# Patient Record
Sex: Male | Born: 1965 | Hispanic: Yes | Marital: Single | Smoking: Current every day smoker
Health system: Southern US, Community
[De-identification: ages and names within clinical notes are randomized; demographics above are authoritative.]

## PROBLEM LIST (undated history)

## (undated) DIAGNOSIS — M109 Gout, unspecified: Secondary | ICD-10-CM

## (undated) DIAGNOSIS — M1990 Unspecified osteoarthritis, unspecified site: Secondary | ICD-10-CM

## (undated) DIAGNOSIS — E119 Type 2 diabetes mellitus without complications: Secondary | ICD-10-CM

---

## 2018-03-29 ENCOUNTER — Other Ambulatory Visit: Payer: Self-pay

## 2018-03-29 ENCOUNTER — Emergency Department: Payer: Self-pay

## 2018-03-29 ENCOUNTER — Encounter: Payer: Self-pay | Admitting: Emergency Medicine

## 2018-03-29 ENCOUNTER — Emergency Department
Admission: EM | Admit: 2018-03-29 | Discharge: 2018-03-29 | Disposition: A | Discharge status: Home / Self Care | Payer: Self-pay | Attending: Emergency Medicine | Admitting: Emergency Medicine

## 2018-03-29 DIAGNOSIS — E119 Type 2 diabetes mellitus without complications: Secondary | ICD-10-CM | POA: Insufficient documentation

## 2018-03-29 DIAGNOSIS — M19012 Primary osteoarthritis, left shoulder: Secondary | ICD-10-CM | POA: Insufficient documentation

## 2018-03-29 DIAGNOSIS — R05 Cough: Secondary | ICD-10-CM | POA: Insufficient documentation

## 2018-03-29 DIAGNOSIS — R053 Chronic cough: Secondary | ICD-10-CM

## 2018-03-29 HISTORY — DX: Type 2 diabetes mellitus without complications: E11.9

## 2018-03-29 LAB — CBC
HCT: 44.9 % (ref 40.0–52.0)
HEMOGLOBIN: 14.9 g/dL (ref 13.0–18.0)
MCH: 27.9 pg (ref 26.0–34.0)
MCHC: 33.1 g/dL (ref 32.0–36.0)
MCV: 84.3 fL (ref 80.0–100.0)
PLATELETS: 255 10*3/uL (ref 150–440)
RBC: 5.33 MIL/uL (ref 4.40–5.90)
RDW: 14.3 % (ref 11.5–14.5)
WBC: 13.5 10*3/uL — AB (ref 3.8–10.6)

## 2018-03-29 LAB — COMPREHENSIVE METABOLIC PANEL
ALK PHOS: 83 U/L (ref 38–126)
ALT: 19 U/L (ref 0–44)
AST: 18 U/L (ref 15–41)
Albumin: 3.5 g/dL (ref 3.5–5.0)
Anion gap: 8 (ref 5–15)
BUN: 16 mg/dL (ref 6–20)
CALCIUM: 9.1 mg/dL (ref 8.9–10.3)
CHLORIDE: 99 mmol/L (ref 98–111)
CO2: 26 mmol/L (ref 22–32)
CREATININE: 0.78 mg/dL (ref 0.61–1.24)
GFR calc Af Amer: >60 mL/min (ref >60)
GFR calc non Af Amer: >60 mL/min (ref >60)
Glucose, Bld: 340 mg/dL — ABNORMAL HIGH (ref 70–99)
Potassium: 4 mmol/L (ref 3.5–5.1)
SODIUM: 133 mmol/L — AB (ref 135–145)
Total Bilirubin: 0.8 mg/dL (ref 0.3–1.2)
Total Protein: 7.7 g/dL (ref 6.5–8.1)

## 2018-03-29 LAB — TROPONIN I: Troponin I: <0.03 ng/mL (ref <0.03)

## 2018-03-29 MED ORDER — CYCLOBENZAPRINE HCL 5 MG PO TABS: ORAL_TABLET | ORAL | 0 refills | Status: DC

## 2018-03-29 MED ORDER — NAPROXEN 500 MG PO TABS: 500.0000 mg | ORAL_TABLET | Freq: Two times a day (BID) | ORAL | 0 refills | Status: AC

## 2018-03-29 MED ORDER — IBUPROFEN 800 MG PO TABS
ORAL_TABLET | ORAL | Status: AC
  Filled 2018-03-29: qty 1

## 2018-03-29 MED ORDER — IBUPROFEN 800 MG PO TABS
800.0000 mg | ORAL_TABLET | Freq: Once | ORAL | Status: AC
  Administered 2018-03-29: 800 mg via ORAL

## 2018-03-29 MED ORDER — ALBUTEROL SULFATE HFA 108 (90 BASE) MCG/ACT IN AERS: 2.0000 | INHALATION_SPRAY | Freq: Four times a day (QID) | RESPIRATORY_TRACT | 0 refills | Status: AC | PRN

## 2018-03-29 NOTE — ED Provider Notes (Signed)
[facility] Emergency Department Provider Note  ____________________________________________  Time seen: Approximately 5:40 PM  I have reviewed the triage vital signs and the nursing notes.   HISTORY  Chief Complaint Shoulder Pain    HPI John [name] is a 52 y.o. male that presents emergency department for evaluation of left shoulder and chest pain since yesterday.  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  Pain in his shoulder and chest is worse when he lifts his arm.  It is also worse when he touches his chest.  He recently got over a cold, and has been coughing for the last week.  Cough improved today and he did not cough at all.  He states that he has an occasional chronic cough from smoking.  He smokes 10 cigarettes/day.  No fever, chills, jaw pain, arm pain, shortness of breath.   Past Medical History:  Diagnosis Date  . Diabetes mellitus without complication (HCC)     There are no active problems to display for this patient.   History reviewed. No pertinent surgical history.  Prior to Admission medications   Medication Sig Start Date End Date Taking? Authorizing Provider  albuterol (PROVENTIL HFA;VENTOLIN HFA) 108 (90 Base) MCG/ACT inhaler Inhale 2 puffs into the lungs every 6 (six) hours as needed for wheezing or shortness of breath. 03/29/18   Enid DerryWagner, Caellum Mancil, PA-C  cyclobenzaprine (FLEXERIL) 5 MG tablet Take 1-2 tablets 3 times daily as needed 03/29/18   Enid DerryWagner, Jamire Shabazz, PA-C  naproxen (NAPROSYN) 500 MG tablet Take 1 tablet (500 mg total) by mouth 2 (two) times daily with a meal. 03/29/18 03/29/19  Enid DerryWagner, Alexismarie Flaim, PA-C    Allergies Patient has no known allergies.  No family history on file.  Social History Social History   Tobacco Use  . Smoking status: Not on file  Substance Use Topics  . Alcohol use: Not on file  . Drug use: Not on file     Review of Systems  Constitutional: No fever/chills ENT: No upper respiratory  complaints. Respiratory: Positive for recent  cough. No SOB. Gastrointestinal: No abdominal pain.  No nausea, no vomiting.  Musculoskeletal: Positive for shoulder pain.  Skin: Negative for rash, abrasions, lacerations, ecchymosis.   ____________________________________________   PHYSICAL EXAM:  VITAL SIGNS: ED Triage Vitals [03/29/18 1451]  Enc Vitals Group     BP 139/74     Pulse Rate 93     Resp 20     Temp 98.2 F (36.8 C)     Temp Source Oral     SpO2 99 %     Weight 189 lb (85.7 kg)     Height 5\' 5"  (1.651 m)     Head Circumference      Peak Flow      Pain Score 10     Pain Loc      Pain Edu?      Excl. in GC?      Constitutional: Alert and oriented. Well appearing and in no acute distress. Eyes: Conjunctivae are normal. PERRL. EOMI. Head: Atraumatic. ENT:      Ears:      Nose: No congestion/rhinnorhea.      Mouth/Throat: Mucous membranes are moist.  Neck: No stridor.  Cardiovascular: Normal rate, regular rhythm.  Good peripheral circulation. Respiratory: Normal respiratory effort without tachypnea or retractions. Lungs CTAB. Good air entry to the bases with no decreased or absent breath sounds. Musculoskeletal: Full range of motion to all extremities. No gross deformities appreciated.  Tenderness  to palpation over left chest wall and left shoulder.  Pain elicited with range of motion of left shoulder.  Neurologic:  Normal speech and language. No gross focal neurologic deficits are appreciated.  Skin:  Skin is warm, dry and intact. No rash noted. Psychiatric: Mood and affect are normal. Speech and behavior are normal. Patient exhibits appropriate insight and judgement.   ____________________________________________   LABS (all labs ordered are listed, but only abnormal results are displayed)  Labs Reviewed  CBC - Abnormal; Notable for the following components:      Result Value   WBC 13.5 (*)    All other components within normal limits  COMPREHENSIVE  METABOLIC PANEL - Abnormal; Notable for the following components:   Sodium 133 (*)    Glucose, Bld 340 (*)    All other components within normal limits  TROPONIN I   ____________________________________________  EKG  NSR ____________________________________________  RADIOLOGY Lexine BatonI, Jaymison Luber, personally viewed and evaluated these images (plain radiographs) as part of my medical decision making, as well as reviewing the written report by the radiologist.  Dg Chest 2 View  Result Date: 03/29/2018 CLINICAL DATA:  Initial evaluation for acute cough, chest pain. EXAM: CHEST - 2 VIEW COMPARISON:  None. FINDINGS: The cardiac and mediastinal silhouettes are stable in size and contour, and remain within normal limits. The lungs are normally inflated. Mild scattered peribronchial thickening present within the upper lobes bilaterally. No airspace consolidation, pleural effusion, or pulmonary edema is identified. There is no pneumothorax. No acute osseous abnormality identified. IMPRESSION: Mild scattered peribronchial thickening within the upper lobes bilaterally, which could reflect sequelae of acute bronchiolitis given the history of cough. No consolidative opacity to suggest pneumonia. Electronically Signed   By: Rise MuBenjamin  McClintock M.D.   On: 03/29/2018 16:28   Dg Shoulder Left  Result Date: 03/29/2018 CLINICAL DATA:  Worsening left shoulder pain since fall a month ago. EXAM: LEFT SHOULDER - 2+ VIEW COMPARISON:  None. FINDINGS: No acute fracture or dislocation. Mild degenerative changes of the acromioclavicular joint. The glenohumeral joint space is preserved. Bone mineralization is normal. Soft tissues are unremarkable. IMPRESSION: 1.  No acute osseous abnormality. 2. Mild acromioclavicular osteoarthritis. Electronically Signed   By: Obie DredgeWilliam T Derry M.D.   On: 03/29/2018 16:28    ____________________________________________    PROCEDURES  Procedure(s) performed:     Procedures    Medications  ibuprofen (ADVIL,MOTRIN) tablet 800 mg (800 mg Oral Given 03/29/18 1647)     ____________________________________________   INITIAL IMPRESSION / ASSESSMENT AND PLAN / ED COURSE  Pertinent labs & imaging results that were available during my care of the patient were reviewed by me and considered in my medical decision making (see chart for details).  Review of the [place] CSRS was performed in accordance of the [facility] prior to dispensing any controlled drugs.   Patient's diagnosis is consistent with osteoarthritis, recent URI and chronic cough.  Vital signs and exam are reassuring.  Pain is reproducible with palpation and movement of shoulder. CBC remarkable for increased blood cell count, consistent with recent URI.  Patient has diabetes and is encouraged to follow-up with primary care for medication management.  He will increase salt intake tonight for mildly low sodium.  Chest x-ray consistent with recent URI.  Patient denies any coughing today and symptoms are improving.  Troponin is negative.  EKG shows normal sinus rhythm.  Shoulder x-ray consistent with osteoarthritis. Pain improved with ibuprofen. Patient will be discharged home with prescriptions for naproxen, Flexeril, albuterol  inhaler. Patient is to follow up with PCP as directed. Patient is given ED precautions to return to the ED for any worsening or new symptoms.     ____________________________________________  FINAL CLINICAL IMPRESSION(S) / ED DIAGNOSES  Final diagnoses:  Osteoarthritis of left shoulder, unspecified osteoarthritis type  Chronic cough      NEW MEDICATIONS STARTED DURING THIS VISIT:  ED Discharge Orders         Ordered    albuterol (PROVENTIL HFA;VENTOLIN HFA) 108 (90 Base) MCG/ACT inhaler  Every 6 hours PRN     03/29/18 1749    naproxen (NAPROSYN) 500 MG tablet  2 times daily with meals     03/29/18 1749    cyclobenzaprine (FLEXERIL) 5 MG tablet     03/29/18 1749               This chart was dictated using voice recognition software/Dragon. Despite best efforts to proofread, errors can occur which can change the meaning. Any change was purely unintentional.    Enid Derry, PA-C 03/29/18 1844    Jene Every, MD 03/30/18 1406

## 2018-03-29 NOTE — ED Notes (Signed)
See triage note  Presents with pain to left shoulder for couple of days  Then developed prod cough and some discomfort in chest with cough  Afebrile on arrival

## 2018-03-29 NOTE — Discharge Instructions (Signed)
Please make an appointment with primary care as soon as possible for daily medication refills.

## 2018-03-29 NOTE — ED Triage Notes (Signed)
L shoulder pain that increases with movment since yesterday. Denies fall or injury.

## 2019-04-21 ENCOUNTER — Emergency Department: Payer: Self-pay

## 2019-04-21 ENCOUNTER — Encounter: Payer: Self-pay | Admitting: Emergency Medicine

## 2019-04-21 ENCOUNTER — Other Ambulatory Visit: Payer: Self-pay

## 2019-04-21 ENCOUNTER — Emergency Department
Admission: EM | Admit: 2019-04-21 | Discharge: 2019-04-21 | Disposition: A | Discharge status: Home / Self Care | Payer: Self-pay | Attending: Emergency Medicine | Admitting: Emergency Medicine

## 2019-04-21 DIAGNOSIS — M19031 Primary osteoarthritis, right wrist: Secondary | ICD-10-CM | POA: Insufficient documentation

## 2019-04-21 DIAGNOSIS — M65272 Calcific tendinitis, left ankle and foot: Secondary | ICD-10-CM | POA: Insufficient documentation

## 2019-04-21 DIAGNOSIS — E119 Type 2 diabetes mellitus without complications: Secondary | ICD-10-CM | POA: Insufficient documentation

## 2019-04-21 DIAGNOSIS — F17210 Nicotine dependence, cigarettes, uncomplicated: Secondary | ICD-10-CM | POA: Insufficient documentation

## 2019-04-21 MED ORDER — KETOROLAC TROMETHAMINE 30 MG/ML IJ SOLN
30.0000 mg | Freq: Once | INTRAMUSCULAR | Status: AC
  Administered 2019-04-21: 10:00:00 30 mg via INTRAMUSCULAR
  Filled 2019-04-21: qty 1

## 2019-04-21 MED ORDER — MELOXICAM 15 MG PO TABS: 15.0000 mg | ORAL_TABLET | Freq: Every day | ORAL | 0 refills | Status: AC

## 2019-04-21 NOTE — Discharge Instructions (Signed)
With [facility] or 1 of the clinics listed on your discharge papers.  You may also go to your PCP if you have one.  Begin taking medication as directed.  Ace wrap for your ankle as needed for support.  You may use ice and elevation to your wrist and ankle as needed for pain and swelling.

## 2019-04-21 NOTE — ED Triage Notes (Signed)
Right wrist pain and swelling.  Says was here for ankle pain few weeks a go.  No injury

## 2019-04-21 NOTE — ED Provider Notes (Signed)
[facility] Emergency Department Provider Note  ____________________________________________   First MD Initiated Contact with Patient 04/21/19 [phone]     (approximate)  I have reviewed the triage vital signs and the nursing notes.   HISTORY  Chief Complaint Wrist Pain   HPI Andres LabrumLuis Scruton is a 53 y.o. male presents to the ED with complaint of right wrist pain that began 1 week ago without any history of injury.  He also states that his left ankle began having pain and swelling several weeks ago and ibuprofen is not helping.  He denies any injury to both areas.  He rates his pain as a 10/10.     Past Medical History:  Diagnosis Date  . Diabetes mellitus without complication (HCC)     There are no active problems to display for this patient.   History reviewed. No pertinent surgical history.  Prior to Admission medications   Medication Sig Start Date End Date Taking? Authorizing Provider  albuterol (PROVENTIL HFA;VENTOLIN HFA) 108 (90 Base) MCG/ACT inhaler Inhale 2 puffs into the lungs every 6 (six) hours as needed for wheezing or shortness of breath. 03/29/18   Enid DerryWagner, Ashley, PA-C  meloxicam (MOBIC) 15 MG tablet Take 1 tablet (15 mg total) by mouth daily. 04/21/19 04/20/20  Tommi RumpsSummers, Rhonda L, PA-C    Allergies Patient has no known allergies.  No family history on file.  Social History Social History   Tobacco Use  . Smoking status: Current Every Day Smoker  . Smokeless tobacco: Never Used  Substance Use Topics  . Alcohol use: Not on file  . Drug use: Not on file    Review of Systems Constitutional: No fever/chills Cardiovascular: Denies chest pain. Respiratory: Denies shortness of breath. Gastrointestinal: No abdominal pain.  No nausea, no vomiting.  Musculoskeletal: Positive for right wrist pain and left ankle pain. Skin: Negative for rash. Neurological: Negative for headaches, focal weakness or numbness.  ___________________________________________   PHYSICAL EXAM:  VITAL SIGNS: ED Triage Vitals  Enc Vitals Group     BP 04/21/19 0828 (!) 156/83     Pulse Rate 04/21/19 0828 71     Resp 04/21/19 0828 19     Temp 04/21/19 0828 97.8 F (36.6 C)     Temp Source 04/21/19 0828 Oral     SpO2 04/21/19 0828 98 %     Weight 04/21/19 0824 178 lb (80.7 kg)     Height 04/21/19 0824 5\' 5"  (1.651 m)     Head Circumference --      Peak Flow --      Pain Score 04/21/19 0824 10     Pain Loc --      Pain Edu? --      Excl. in GC? --     Constitutional: Alert and oriented. Well appearing and in no acute distress. Eyes: Conjunctivae are normal.  Head: Atraumatic. Neck: No stridor.   Cardiovascular: Normal rate, regular rhythm. Grossly normal heart sounds.  Good peripheral circulation. Respiratory: Normal respiratory effort.  No retractions. Lungs CTAB. Musculoskeletal: On examination of the right wrist in comparison with the left there is some soft tissue edema and generalized tenderness to palpation.  Patient is able to flex and extend with minimal restriction.  Skin is intact.  No erythema or warmth is noted.  Examination of the left ankle shows mild soft tissue edema especially on the lateral aspect.  Patient is able to flex and extend and ambulate without any assistance.  Skin is intact and  no erythema or warmth is noted. Neurologic:  Normal speech and language. No gross focal neurologic deficits are appreciated. No gait instability. Skin:  Skin is warm, dry and intact. No rash noted. Psychiatric: Mood and affect are normal. Speech and behavior are normal.  ____________________________________________   LABS (all labs ordered are listed, but only abnormal results are displayed)  Labs Reviewed - No data to display  RADIOLOGY  Official radiology report(s): Dg Wrist Complete Right  Result Date: 04/21/2019 CLINICAL DATA:  Pain and swelling of the right wrist for 1 week. No known injury.  EXAM: RIGHT WRIST - COMPLETE 3+ VIEW COMPARISON:  None. FINDINGS: There is no evidence of fracture or dislocation. There is no evidence of arthropathy or other focal bone abnormality. Soft tissues are unremarkable. IMPRESSION: Normal exam. Electronically Signed   By: Lorriane Shire M.D.   On: 04/21/2019 09:13   Dg Ankle Complete Left  Result Date: 04/21/2019 CLINICAL DATA:  Left ankle pain for several weeks. No known injury. EXAM: LEFT ANKLE COMPLETE - 3+ VIEW COMPARISON:  None. FINDINGS: There is no fracture, dislocation, or ankle joint effusion. Small marginal osteophytes on the distal tibia including at the tip of the medial malleolus. Calcific tendinopathy of the distal Achilles tendon. IMPRESSION: Minimal degenerative changes of the distal tibia. Calcific tendinopathy of the distal Achilles tendon. Electronically Signed   By: Lorriane Shire M.D.   On: 04/21/2019 09:15    ____________________________________________   PROCEDURES  Procedure(s) performed (including Critical Care):  Procedures Ace wrap and postop shoe was applied by Halford Decamp, RN.  ____________________________________________   INITIAL IMPRESSION / ASSESSMENT AND PLAN / ED COURSE  As part of my medical decision making, I reviewed the following data within the electronic MEDICAL RECORD NUMBER Notes from prior ED visits and [place] Controlled Substance Database  53 year old male presents to the ED with complaint of right wrist pain and left ankle pain for 1 to 2 weeks without any history of injury.  He has been taking ibuprofen without any relief.  X-rays show osteoarthritis and a calcific tendinitis in his left ankle.  Patient was made aware and he was given a Toradol shot for his pain at the present.  Patient was also given a prescription for meloxicam 15 mg 1 daily with food.  He is to follow-up with his PCP or 1 of the clinics listed on his discharge papers. ____________________________________________   FINAL CLINICAL  IMPRESSION(S) / ED DIAGNOSES  Final diagnoses:  Osteoarthritis of right wrist, unspecified osteoarthritis type  Calcific tendinitis, left ankle and foot     ED Discharge Orders         Ordered    meloxicam (MOBIC) 15 MG tablet  Daily     04/21/19 0955           Note:  This document was prepared using Dragon voice recognition software and may include unintentional dictation errors.    Johnn Hai, PA-C 04/21/19 1012    Blake Divine, MD 04/21/19 [phone]

## 2019-04-21 NOTE — ED Notes (Signed)
See triage note  Presents with pain to right wrist for about 1 week w/o injury   Swelling noted to right wrist  Good pulses  Also has had some pain to left ankle a few weeks ago

## 2019-07-03 ENCOUNTER — Other Ambulatory Visit: Payer: Self-pay

## 2019-07-03 ENCOUNTER — Emergency Department: Payer: Self-pay

## 2019-07-03 ENCOUNTER — Encounter: Payer: Self-pay | Admitting: Emergency Medicine

## 2019-07-03 DIAGNOSIS — R0789 Other chest pain: Secondary | ICD-10-CM | POA: Insufficient documentation

## 2019-07-03 DIAGNOSIS — Z5321 Procedure and treatment not carried out due to patient leaving prior to being seen by health care provider: Secondary | ICD-10-CM | POA: Insufficient documentation

## 2019-07-03 LAB — BASIC METABOLIC PANEL
Anion gap: 10 (ref 5–15)
BUN: 21 mg/dL — ABNORMAL HIGH (ref 6–20)
CO2: 25 mmol/L (ref 22–32)
Calcium: 9.1 mg/dL (ref 8.9–10.3)
Chloride: 102 mmol/L (ref 98–111)
Creatinine, Ser: 0.75 mg/dL (ref 0.61–1.24)
GFR calc Af Amer: >60 mL/min (ref >60)
GFR calc non Af Amer: >60 mL/min (ref >60)
Glucose, Bld: 330 mg/dL — ABNORMAL HIGH (ref 70–99)
Potassium: 4.1 mmol/L (ref 3.5–5.1)
Sodium: 137 mmol/L (ref 135–145)

## 2019-07-03 LAB — CBC
HCT: 44.2 % (ref 39.0–52.0)
Hemoglobin: 14.4 g/dL (ref 13.0–17.0)
MCH: 27 pg (ref 26.0–34.0)
MCHC: 32.6 g/dL (ref 30.0–36.0)
MCV: 82.8 fL (ref 80.0–100.0)
Platelets: 294 10*3/uL (ref 150–400)
RBC: 5.34 MIL/uL (ref 4.22–5.81)
RDW: 12.8 % (ref 11.5–15.5)
WBC: 11.1 10*3/uL — ABNORMAL HIGH (ref 4.0–10.5)
nRBC: 0 % (ref 0.0–0.2)

## 2019-07-03 LAB — TROPONIN I (HIGH SENSITIVITY): Troponin I (High Sensitivity): 2 ng/L (ref <18)

## 2019-07-03 NOTE — ED Triage Notes (Signed)
Pt arrived via POV with reports of central chest pain since last night, pt states pain hurts when he takes in a deep breath and moves his arms a certain way. Pt also c/o shortness of breath.  Pt also reports back pain w/ the chest pain.

## 2019-07-04 ENCOUNTER — Other Ambulatory Visit: Payer: Self-pay

## 2019-07-04 ENCOUNTER — Emergency Department
Admission: EM | Admit: 2019-07-04 | Discharge: 2019-07-04 | Disposition: A | Discharge status: Home / Self Care | Payer: Self-pay | Attending: Emergency Medicine | Admitting: Emergency Medicine

## 2019-07-04 DIAGNOSIS — R0789 Other chest pain: Secondary | ICD-10-CM | POA: Insufficient documentation

## 2019-07-04 DIAGNOSIS — Z7984 Long term (current) use of oral hypoglycemic drugs: Secondary | ICD-10-CM | POA: Insufficient documentation

## 2019-07-04 DIAGNOSIS — Z791 Long term (current) use of non-steroidal anti-inflammatories (NSAID): Secondary | ICD-10-CM | POA: Insufficient documentation

## 2019-07-04 DIAGNOSIS — F17200 Nicotine dependence, unspecified, uncomplicated: Secondary | ICD-10-CM | POA: Insufficient documentation

## 2019-07-04 DIAGNOSIS — E119 Type 2 diabetes mellitus without complications: Secondary | ICD-10-CM | POA: Insufficient documentation

## 2019-07-04 LAB — TROPONIN I (HIGH SENSITIVITY): Troponin I (High Sensitivity): <2 ng/L (ref <18)

## 2019-07-04 MED ORDER — NAPROXEN 500 MG PO TABS: 500.0000 mg | ORAL_TABLET | Freq: Two times a day (BID) | ORAL | 0 refills | Status: DC

## 2019-07-04 MED ORDER — METFORMIN HCL 500 MG PO TABS: 500.0000 mg | ORAL_TABLET | Freq: Two times a day (BID) | ORAL | 0 refills | Status: DC

## 2019-07-04 NOTE — ED Triage Notes (Signed)
Pt was here last night with substernal chest tightness but LWBS, pt states he has had the pain since yesterday morning. Denies SOB, diaphoresis nausea or other sx with it. Pt is in NAD. Spoke with EDP and only wants troponin and ECG repeated at this time,.

## 2019-07-04 NOTE — ED Notes (Signed)
Call x 1

## 2019-07-04 NOTE — ED Notes (Signed)
Entered room to discharge patient. Pt not in room at this time. Unable to locate patient. Will keep d/c paperwork and prescriptions just in case patient comes back to get them. Spoke with prior Archivist and informed her of current situation. Not sure why pt left.

## 2019-07-04 NOTE — ED Provider Notes (Signed)
[facility] Emergency Department Provider Note  ____________________________________________  Time seen: Approximately 11:01 AM  I have reviewed the triage vital signs and the nursing notes.   HISTORY  Chief Complaint Chest Pain    HPI John LabrumLuis [name] is a 53 y.o. male with a history of diabetes who comes the ED complaining of gradual onset of central chest pain described as tightness that started yesterday afternoon.  Constant, worse with movement, no alleviating factors.  No shortness of breath diaphoresis vomiting or radiation.  Not exertional, not pleuritic.  No significant trauma.  Currently mild intensity.  Patient is on Metformin as well as another diabetes medication.  He reports that he takes them only intermittently to try and save on cost due to lack of insurance and employment currently.  He reports he does have enough of his medicines at home.  He does not take his blood sugar regularly at home.    Past Medical History:  Diagnosis Date  . Diabetes mellitus without complication (HCC)      There are no active problems to display for this patient.    History reviewed. No pertinent surgical history.   Prior to Admission medications   Medication Sig Start Date End Date Taking? Authorizing Provider  albuterol (PROVENTIL HFA;VENTOLIN HFA) 108 (90 Base) MCG/ACT inhaler Inhale 2 puffs into the lungs every 6 (six) hours as needed for wheezing or shortness of breath. 03/29/18   Enid DerryWagner, Ashley, PA-C  meloxicam (MOBIC) 15 MG tablet Take 1 tablet (15 mg total) by mouth daily. 04/21/19 04/20/20  Tommi RumpsSummers, Rhonda L, PA-C  metFORMIN (GLUCOPHAGE) 500 MG tablet Take 1 tablet (500 mg total) by mouth 2 (two) times daily with a meal. 07/04/19   Sharman CheekStafford, Elyas Villamor, MD  naproxen (NAPROSYN) 500 MG tablet Take 1 tablet (500 mg total) by mouth 2 (two) times daily with a meal. 07/04/19   Sharman CheekStafford, Caitlynn Ju, MD     Allergies Patient has no known allergies.   No family history  on file.  Social History Social History   Tobacco Use  . Smoking status: Current Every Day Smoker  . Smokeless tobacco: Never Used  Substance Use Topics  . Alcohol use: Not Currently  . Drug use: Not Currently    Review of Systems  Constitutional:   No fever or chills.  ENT:   No sore throat. No rhinorrhea. Cardiovascular: Positive chest pain as above without syncope. Respiratory:   No dyspnea or cough. Gastrointestinal:   Negative for abdominal pain, vomiting and diarrhea.  Musculoskeletal:   Negative for focal pain or swelling All other systems reviewed and are negative except as documented above in ROS and HPI.  ____________________________________________   PHYSICAL EXAM:  VITAL SIGNS: ED Triage Vitals  Enc Vitals Group     BP 07/04/19 0819 139/82     Pulse Rate 07/04/19 0819 74     Resp 07/04/19 0819 16     Temp 07/04/19 0819 98.8 F (37.1 C)     Temp Source 07/04/19 0819 Oral     SpO2 07/04/19 0819 96 %     Weight 07/04/19 0816 178 lb (80.7 kg)     Height 07/04/19 0816 5\' 5"  (1.651 m)     Head Circumference --      Peak Flow --      Pain Score 07/04/19 0816 8     Pain Loc --      Pain Edu? --      Excl. in GC? --  Vital signs reviewed, nursing assessments reviewed.   Constitutional:   Alert and oriented. Non-toxic appearance. Eyes:   Conjunctivae are normal. EOMI. PERRL. ENT      Head:   Normocephalic and atraumatic.      Nose:   Wearing a mask.      Mouth/Throat:   Wearing a mask.      Neck:   No meningismus. Full ROM. Hematological/Lymphatic/Immunilogical:   No cervical lymphadenopathy. Cardiovascular:   RRR. Symmetric bilateral radial and DP pulses.  No murmurs. Cap refill less than 2 seconds. Respiratory:   Normal respiratory effort without tachypnea/retractions. Breath sounds are clear and equal bilaterally. No wheezes/rales/rhonchi. Gastrointestinal:   Soft and nontender. Non distended. There is no CVA tenderness.  No rebound, rigidity, or  guarding.  Musculoskeletal:   Normal range of motion in all extremities. No joint effusions.  No lower extremity tenderness.  No edema.  Chest wall exquisitely tender to the touch over the sternomanubrial junction, reproducing his pain Neurologic:   Normal speech and language.  Motor grossly intact. No acute focal neurologic deficits are appreciated.  Skin:    Skin is warm, dry and intact. No rash noted.  No petechiae, purpura, or bullae.  ____________________________________________    LABS (pertinent positives/negatives) (all labs ordered are listed, but only abnormal results are displayed) Labs Reviewed  TROPONIN I (HIGH SENSITIVITY)  TROPONIN I (HIGH SENSITIVITY)   ____________________________________________   EKG  Interpreted by me Normal sinus rhythm rate of 79, normal axis and intervals.  Poor R wave progression.  Normal ST segments and T waves.  No ischemic changes.  Slight PR depression in the inferior leads but overall not consistent with pericarditis.  ____________________________________________    RADIOLOGY  Dg Chest 2 View  Result Date: 07/03/2019 CLINICAL DATA:  Chest pain EXAM: CHEST - 2 VIEW COMPARISON:  03/29/2018 FINDINGS: Heart and mediastinal contours are within normal limits. No focal opacities or effusions. No acute bony abnormality. IMPRESSION: No active cardiopulmonary disease. Electronically Signed   By: Rolm Baptise M.D.   On: 07/03/2019 22:41    ____________________________________________   PROCEDURES Procedures  ____________________________________________    CLINICAL IMPRESSION / ASSESSMENT AND PLAN / ED COURSE  Medications ordered in the ED: Medications - No data to display  Pertinent labs & imaging results that were available during my care of the patient were reviewed by me and considered in my medical decision making (see chart for details).  John [name] was evaluated in Emergency Department on 07/04/2019 for the symptoms  described in the history of present illness. He was evaluated in the context of the global COVID-19 pandemic, which necessitated consideration that the patient might be at risk for infection with the SARS-CoV-2 virus that causes COVID-19. Institutional protocols and algorithms that pertain to the evaluation of patients at risk for COVID-19 are in a state of rapid change based on information released by regulatory bodies including the CDC and federal and state organizations. These policies and algorithms were followed during the patient's care in the ED.   Patient presents with atypical chest pain which appears to be due to chest wall strain/inflammation.Considering the patient's symptoms, medical history, and physical examination today, I have low suspicion for ACS, PE, TAD, pneumothorax, carditis, mediastinitis, pneumonia, CHF, or sepsis.  Managed with NSAIDs.  Provided a refill prescription for his Metformin in case he runs out, encouraged to take Metformin every day and check his blood sugar and follow-up with primary care.  States he is awaiting on Medicaid to  transfer from [place] from where he recently moved.     ____________________________________________   FINAL CLINICAL IMPRESSION(S) / ED DIAGNOSES    Final diagnoses:  Chest wall pain     ED Discharge Orders         Ordered    naproxen (NAPROSYN) 500 MG tablet  2 times daily with meals     07/04/19 1101    metFORMIN (GLUCOPHAGE) 500 MG tablet  2 times daily with meals     07/04/19 1101          Portions of this note were generated with dragon dictation software. Dictation errors may occur despite best attempts at proofreading.   Sharman Cheek, MD 07/04/19 1105

## 2019-07-04 NOTE — ED Notes (Signed)
No answer in waiting room when called 3x

## 2019-10-13 ENCOUNTER — Encounter: Payer: Self-pay | Admitting: Emergency Medicine

## 2019-10-13 ENCOUNTER — Other Ambulatory Visit: Payer: Self-pay

## 2019-10-13 DIAGNOSIS — M109 Gout, unspecified: Secondary | ICD-10-CM | POA: Insufficient documentation

## 2019-10-13 DIAGNOSIS — Z79899 Other long term (current) drug therapy: Secondary | ICD-10-CM | POA: Insufficient documentation

## 2019-10-13 DIAGNOSIS — F17210 Nicotine dependence, cigarettes, uncomplicated: Secondary | ICD-10-CM | POA: Insufficient documentation

## 2019-10-13 DIAGNOSIS — M1711 Unilateral primary osteoarthritis, right knee: Secondary | ICD-10-CM | POA: Insufficient documentation

## 2019-10-13 DIAGNOSIS — E119 Type 2 diabetes mellitus without complications: Secondary | ICD-10-CM | POA: Insufficient documentation

## 2019-10-13 DIAGNOSIS — Z7984 Long term (current) use of oral hypoglycemic drugs: Secondary | ICD-10-CM | POA: Insufficient documentation

## 2019-10-13 NOTE — ED Triage Notes (Signed)
Pt to triage via w/c, mask in place, appears uncomfortable, restless; c/o rt knee pain & swelling tonight with no known injury; no redness noted

## 2019-10-14 ENCOUNTER — Emergency Department
Admission: EM | Admit: 2019-10-14 | Discharge: 2019-10-14 | Disposition: A | Discharge status: Home / Self Care | Payer: Self-pay | Attending: Emergency Medicine | Admitting: Emergency Medicine

## 2019-10-14 ENCOUNTER — Emergency Department: Payer: Self-pay

## 2019-10-14 DIAGNOSIS — M1711 Unilateral primary osteoarthritis, right knee: Secondary | ICD-10-CM

## 2019-10-14 DIAGNOSIS — M109 Gout, unspecified: Secondary | ICD-10-CM

## 2019-10-14 LAB — URIC ACID: Uric Acid, Serum: 5.5 mg/dL (ref 3.7–8.6)

## 2019-10-14 MED ORDER — COLCHICINE 0.3 MG HALF TABLET
0.3000 mg | ORAL_TABLET | Freq: Once | ORAL | Status: AC
  Administered 2019-10-14: 02:00:00 0.3 mg via ORAL
  Filled 2019-10-14: qty 1

## 2019-10-14 MED ORDER — KETOROLAC TROMETHAMINE 30 MG/ML IJ SOLN
30.0000 mg | Freq: Once | INTRAMUSCULAR | Status: AC
  Administered 2019-10-14: 30 mg via INTRAVENOUS
  Filled 2019-10-14: qty 1

## 2019-10-14 MED ORDER — INDOMETHACIN 50 MG PO CAPS: 50.0000 mg | ORAL_CAPSULE | Freq: Three times a day (TID) | ORAL | 0 refills | Status: AC

## 2019-10-14 NOTE — ED Notes (Signed)
Patient resting in bed and provided snacks by [employer]

## 2019-10-14 NOTE — ED Notes (Signed)
Patient resting in bed with pants under right knee. Patient states that he has swelling and pain in the right knee that started tonight. Patient has swelling noted above the knee

## 2019-10-14 NOTE — ED Provider Notes (Signed)
[facility] Emergency Department Provider Note  ____________________________________________   First MD Initiated Contact with Patient 10/14/19 0045     (approximate)  I have reviewed the triage vital signs and the nursing notes.   HISTORY  Chief Complaint Knee Pain   HPI Larkin Morelos is a 54 y.o. male with below list of previous medical conditions presents to the emergency department secondary to nontraumatic right knee pain and swelling which patient states began tonight.  Patient denies any fever no redness to the knee.  Patient states that he was evaluated at a hospital in [facility] for the same in the past.  Patient states current pain score is 9 out of 10        Past Medical History:  Diagnosis Date  . Diabetes mellitus without complication (HCC)     There are no problems to display for this patient.   History reviewed. No pertinent surgical history.  Prior to Admission medications   Medication Sig Start Date End Date Taking? Authorizing Provider  albuterol (PROVENTIL HFA;VENTOLIN HFA) 108 (90 Base) MCG/ACT inhaler Inhale 2 puffs into the lungs every 6 (six) hours as needed for wheezing or shortness of breath. 03/29/18   Enid Derry, PA-C  meloxicam (MOBIC) 15 MG tablet Take 1 tablet (15 mg total) by mouth daily. 04/21/19 04/20/20  Tommi Rumps, PA-C  metFORMIN (GLUCOPHAGE) 500 MG tablet Take 1 tablet (500 mg total) by mouth 2 (two) times daily with a meal. 07/04/19   Sharman Cheek, MD  naproxen (NAPROSYN) 500 MG tablet Take 1 tablet (500 mg total) by mouth 2 (two) times daily with a meal. 07/04/19   Sharman Cheek, MD    Allergies Patient has no known allergies.  No family history on file.  Social History Social History   Tobacco Use  . Smoking status: Current Every Day Smoker  . Smokeless tobacco: Never Used  Substance Use Topics  . Alcohol use: Not Currently  . Drug use: Not Currently    Review of  Systems Constitutional: No fever/chills Eyes: No visual changes. ENT: No sore throat. Cardiovascular: Denies chest pain. Respiratory: Denies shortness of breath. Gastrointestinal: No abdominal pain.  No nausea, no vomiting.  No diarrhea.  No constipation. Genitourinary: Negative for dysuria. Musculoskeletal: Negative for neck pain.  Negative for back pain.  Positive for right knee pain Integumentary: Negative for rash. Neurological: Negative for headaches, focal weakness or numbness.  ____________________________________________   PHYSICAL EXAM:  VITAL SIGNS: ED Triage Vitals  Enc Vitals Group     BP 10/13/19 2357 (!) 141/89     Pulse Rate 10/13/19 2357 89     Resp 10/13/19 2357 18     Temp 10/13/19 2357 98.9 F (37.2 C)     Temp src --      SpO2 10/13/19 2357 98 %     Weight 10/13/19 2355 80.7 kg (178 lb)     Height 10/13/19 2355 1.676 m (5\' 6" )     Head Circumference --      Peak Flow --      Pain Score 10/13/19 2355 9     Pain Loc --      Pain Edu? --      Excl. in [place]? --     Constitutional: Alert and oriented.  Apparent discomfort Eyes: Conjunctivae are normal.  Mouth/Throat: Patient is wearing a mask. Neck: No stridor.  No meningeal signs.   Cardiovascular: Normal rate, regular rhythm. Good peripheral circulation. Grossly normal heart  sounds. Respiratory: Normal respiratory effort.  No retractions. Musculoskeletal: Right knee warm to the touch no evidence of cellulitis.  Swelling noted to the knee as well Neurologic:  Normal speech and language. No gross focal neurologic deficits are appreciated.  Skin:  Skin is warm, dry and intact. Psychiatric: Mood and affect are normal. Speech and behavior are normal.  ____________________________________________   LABS (all labs ordered are listed, but only abnormal results are displayed)  Labs Reviewed  URIC ACID   _________________________________________  RADIOLOGY I, Ch[place]ar, personally viewed and  evaluated these images (plain radiographs) as part of my medical decision making, as well as reviewing the written report by the radiologist.  ED MD interpretation: Moderate suprapatellar joint effusion mild osteoarthritis noted on x-ray per radiologist  Official radiology report(s): DG Knee Complete 4 Views Right  Result Date: 10/14/2019 CLINICAL DATA:  Right knee pain and swelling, no history of trauma EXAM: RIGHT KNEE - COMPLETE 4+ VIEW COMPARISON:  None. FINDINGS: Frontal, bilateral oblique, and lateral views of the right knee are obtained. There is mild patellofemoral compartment osteoarthritis. Chondrocalcinosis is seen within the lateral compartment. No fracture, subluxation, or dislocation. Moderate suprapatellar effusion. Remaining soft tissues are unremarkable. IMPRESSION: 1. Moderate suprapatellar joint effusion. 2. Mild osteoarthritis. Electronically Signed   By: Randa Ngo M.D.   On: 10/14/2019 00:36    Procedures   ____________________________________________   INITIAL IMPRESSION / MDM / ASSESSMENT AND PLAN / ED COURSE  As part of my medical decision making, I reviewed the following data within the electronic MEDICAL RECORD NUMBER   54 year old male presented with above-stated history and physical exam consistent with gout.  Patient given Toradol 30 mg IV and colchicine 0.3 mg with current pain score now 2 out of 10.  I suspect gout to be the etiology for the patient's discomfort.  Patient will be prescribed indomethacin for home  ____________________________________________  FINAL CLINICAL IMPRESSION(S) / ED DIAGNOSES  Final diagnoses:  Acute gout of right knee, unspecified cause  Osteoarthritis of right knee, unspecified osteoarthritis type     MEDICATIONS GIVEN DURING THIS VISIT:  Medications  colchicine tablet 0.3 mg (0.3 mg Oral Given 10/14/19 0154)  ketorolac (TORADOL) 30 MG/ML injection 30 mg (30 mg Intravenous Given 10/14/19 0134)     ED Discharge Orders     None      *Please note:  Efrem Pitstick was evaluated in Emergency Department on 10/14/2019 for the symptoms described in the history of present illness. He was evaluated in the context of the global COVID-19 pandemic, which necessitated consideration that the patient might be at risk for infection with the SARS-CoV-2 virus that causes COVID-19. Institutional protocols and algorithms that pertain to the evaluation of patients at risk for COVID-19 are in a state of rapid change based on information released by regulatory bodies including the CDC and federal and state organizations. These policies and algorithms were followed during the patient's care in the ED.  Some ED evaluations and interventions may be delayed as a result of limited staffing during the pandemic.*  Note:  This document was prepared using Dragon voice recognition software and may include unintentional dictation errors.   Gregor Hams, MD 10/14/19 [phone]

## 2019-10-14 NOTE — ED Notes (Signed)
Patient provided warm blanket and juice with crackers. Resting quietly

## 2019-10-14 NOTE — ED Notes (Signed)
Patient states pain has improved and it is not as painful to move his knee

## 2019-11-10 ENCOUNTER — Emergency Department
Admission: EM | Admit: 2019-11-10 | Discharge: 2019-11-10 | Disposition: A | Discharge status: Home / Self Care | Payer: Medicaid Other | Attending: Emergency Medicine | Admitting: Emergency Medicine

## 2019-11-10 ENCOUNTER — Other Ambulatory Visit: Payer: Self-pay

## 2019-11-10 DIAGNOSIS — F17210 Nicotine dependence, cigarettes, uncomplicated: Secondary | ICD-10-CM | POA: Insufficient documentation

## 2019-11-10 DIAGNOSIS — R739 Hyperglycemia, unspecified: Secondary | ICD-10-CM

## 2019-11-10 DIAGNOSIS — Z760 Encounter for issue of repeat prescription: Secondary | ICD-10-CM | POA: Insufficient documentation

## 2019-11-10 DIAGNOSIS — E1165 Type 2 diabetes mellitus with hyperglycemia: Secondary | ICD-10-CM | POA: Insufficient documentation

## 2019-11-10 LAB — GLUCOSE, CAPILLARY
Glucose-Capillary: 306 mg/dL — ABNORMAL HIGH (ref 70–99)
Glucose-Capillary: 249 mg/dL — ABNORMAL HIGH (ref 70–99)

## 2019-11-10 LAB — CBC
HCT: 44.1 % (ref 39.0–52.0)
Hemoglobin: 14.3 g/dL (ref 13.0–17.0)
MCH: 26.8 pg (ref 26.0–34.0)
MCHC: 32.4 g/dL (ref 30.0–36.0)
MCV: 82.6 fL (ref 80.0–100.0)
Platelets: 338 10*3/uL (ref 150–400)
RBC: 5.34 MIL/uL (ref 4.22–5.81)
RDW: 12.5 % (ref 11.5–15.5)
WBC: 17.2 10*3/uL — ABNORMAL HIGH (ref 4.0–10.5)
nRBC: 0 % (ref 0.0–0.2)

## 2019-11-10 LAB — URINALYSIS, COMPLETE (UACMP) WITH MICROSCOPIC
Bacteria, UA: NONE SEEN
Bilirubin Urine: NEGATIVE
Glucose, UA: >=500 mg/dL — AB
Hgb urine dipstick: NEGATIVE
Ketones, ur: NEGATIVE mg/dL
Leukocytes,Ua: NEGATIVE
Nitrite: NEGATIVE
Protein, ur: NEGATIVE mg/dL
Specific Gravity, Urine: 1.039 — ABNORMAL HIGH (ref 1.005–1.030)
pH: 5 (ref 5.0–8.0)

## 2019-11-10 LAB — BASIC METABOLIC PANEL
Anion gap: 10 (ref 5–15)
BUN: 24 mg/dL — ABNORMAL HIGH (ref 6–20)
CO2: 23 mmol/L (ref 22–32)
Calcium: 9.2 mg/dL (ref 8.9–10.3)
Chloride: 101 mmol/L (ref 98–111)
Creatinine, Ser: 0.83 mg/dL (ref 0.61–1.24)
GFR calc Af Amer: >60 mL/min (ref >60)
GFR calc non Af Amer: >60 mL/min (ref >60)
Glucose, Bld: 328 mg/dL — ABNORMAL HIGH (ref 70–99)
Potassium: 4 mmol/L (ref 3.5–5.1)
Sodium: 134 mmol/L — ABNORMAL LOW (ref 135–145)

## 2019-11-10 MED ORDER — INSULIN ASPART 100 UNIT/ML ~~LOC~~ SOLN: 5.0000 [IU] | Freq: Once | SUBCUTANEOUS | Status: DC

## 2019-11-10 MED ORDER — METFORMIN HCL 1000 MG PO TABS: 1000.0000 mg | ORAL_TABLET | Freq: Two times a day (BID) | ORAL | 1 refills | Status: AC

## 2019-11-10 MED ORDER — SODIUM CHLORIDE 0.9 % IV BOLUS: 1000.0000 mL | Freq: Once | INTRAVENOUS | Status: DC

## 2019-11-10 MED ORDER — INSULIN ASPART 100 UNIT/ML ~~LOC~~ SOLN
5.0000 [IU] | Freq: Once | SUBCUTANEOUS | Status: AC
  Administered 2019-11-10: 5 [IU] via SUBCUTANEOUS
  Filled 2019-11-10: qty 1

## 2019-11-10 NOTE — ED Triage Notes (Signed)
Pt states he has been out of his metformin for over a month and today when he checked his CBG was >400. Denies any pain or other sx at this time.

## 2019-11-10 NOTE — ED Notes (Signed)
Green and purple top tube sent to lab.

## 2019-11-10 NOTE — Discharge Instructions (Addendum)
Take the Metformin as prescribed.  Call one of the numbers above for a low-cost primary care doctor in the area.  When re-starting Metformin, you could get some nausea, diarrhea. If this occurs, cut back to one half tablet twice a day for 5-7 days, then go up to the full dose.

## 2019-11-10 NOTE — ED Provider Notes (Signed)
[facility] Emergency Department Provider Note  ____________________________________________   First MD Initiated Contact with Patient 11/10/19 [phone]     (approximate)  I have reviewed the triage vital signs and the nursing notes.   HISTORY  Chief Complaint Hyperglycemia    HPI John [name] is a 54 y.o. male  With h/o T2DM here with hyperglycemia. Pt reports he is supposed to be on Metformin 1000 mg BID and a "little blue pill" for his sugar. He states that he has been out of his Metformin for the last 2 months or so.  He recently moved here from [place].  He states that since then, he has noticed that he has had some mild increased thirst and has felt like his sugars have been increasing.  He checked his sugar this morning, and it was reportedly over 500.  Subsequent presents for evaluation.  Denies any shortness of breath.  No recent fevers or chills.  He is here because he does not currently have a doctor and would like to refill his Metformin.  No other complaints.  No chest pain.        Past Medical History:  Diagnosis Date  . Diabetes mellitus without complication (HCC)     There are no problems to display for this patient.   No past surgical history on file.  Prior to Admission medications   Medication Sig Start Date End Date Taking? Authorizing Provider  albuterol (PROVENTIL HFA;VENTOLIN HFA) 108 (90 Base) MCG/ACT inhaler Inhale 2 puffs into the lungs every 6 (six) hours as needed for wheezing or shortness of breath. 03/29/18   Enid Derry, PA-C  meloxicam (MOBIC) 15 MG tablet Take 1 tablet (15 mg total) by mouth daily. 04/21/19 04/20/20  Tommi Rumps, PA-C  metFORMIN (GLUCOPHAGE) 1000 MG tablet Take 1 tablet (1,000 mg total) by mouth 2 (two) times daily with a meal. 11/10/19 01/09/20  Shaune Pollack, MD  naproxen (NAPROSYN) 500 MG tablet Take 1 tablet (500 mg total) by mouth 2 (two) times daily with a meal. 07/04/19   Sharman Cheek, MD     Allergies Patient has no known allergies.  No family history on file.  Social History Social History   Tobacco Use  . Smoking status: Current Every Day Smoker  . Smokeless tobacco: Never Used  Substance Use Topics  . Alcohol use: Not Currently  . Drug use: Not Currently    Review of Systems  Review of Systems  Constitutional: Positive for fatigue. Negative for chills and fever.  HENT: Negative for sore throat.   Respiratory: Negative for shortness of breath.   Cardiovascular: Negative for chest pain.  Gastrointestinal: Negative for abdominal pain.  Genitourinary: Negative for flank pain.  Musculoskeletal: Negative for neck pain.  Skin: Negative for rash and wound.  Allergic/Immunologic: Negative for immunocompromised state.  Neurological: Positive for weakness. Negative for numbness.  Hematological: Does not bruise/bleed easily.  All other systems reviewed and are negative.    ____________________________________________  PHYSICAL EXAM:      VITAL SIGNS: ED Triage Vitals  Enc Vitals Group     BP 11/10/19 0842 (!) 154/79     Pulse Rate 11/10/19 0842 100     Resp 11/10/19 0842 17     Temp 11/10/19 0844 98.7 F (37.1 C)     Temp Source 11/10/19 0842 Oral     SpO2 11/10/19 0842 98 %     Weight 11/10/19 0842 185 lb (83.9 kg)     Height 11/10/19  7564 5\' 6"  (1.676 m)     Head Circumference --      Peak Flow --      Pain Score 11/10/19 0842 0     Pain Loc --      Pain Edu? --      Excl. in [place]? --      Physical Exam Vitals and nursing note reviewed.  Constitutional:      General: He is not in acute distress.    Appearance: He is well-developed.  HENT:     Head: Normocephalic and atraumatic.     Mouth/Throat:     Mouth: Mucous membranes are dry.  Eyes:     Conjunctiva/sclera: Conjunctivae normal.  Cardiovascular:     Rate and Rhythm: Normal rate and regular rhythm.     Heart sounds: Normal heart sounds. No murmur. No friction rub.  Pulmonary:      Effort: Pulmonary effort is normal. No respiratory distress.     Breath sounds: Normal breath sounds. No wheezing or rales.  Abdominal:     General: There is no distension.     Palpations: Abdomen is soft.     Tenderness: There is no abdominal tenderness.  Musculoskeletal:     Cervical back: Neck supple.  Skin:    General: Skin is warm.     Capillary Refill: Capillary refill takes less than 2 seconds.  Neurological:     Mental Status: He is alert and oriented to person, place, and time.     Motor: No abnormal muscle tone.       ____________________________________________   LABS (all labs ordered are listed, but only abnormal results are displayed)  Labs Reviewed  GLUCOSE, CAPILLARY - Abnormal; Notable for the following components:      Result Value   Glucose-Capillary 306 (*)    All other components within normal limits  BASIC METABOLIC PANEL - Abnormal; Notable for the following components:   Sodium 134 (*)    Glucose, Bld 328 (*)    BUN 24 (*)    All other components within normal limits  CBC - Abnormal; Notable for the following components:   WBC 17.2 (*)    All other components within normal limits  URINALYSIS, COMPLETE (UACMP) WITH MICROSCOPIC - Abnormal; Notable for the following components:   Color, Urine YELLOW (*)    APPearance CLEAR (*)    Specific Gravity, Urine 1.039 (*)    Glucose, UA >=500 (*)    All other components within normal limits  GLUCOSE, CAPILLARY - Abnormal; Notable for the following components:   Glucose-Capillary 249 (*)    All other components within normal limits  CBG MONITORING, ED  CBG MONITORING, ED  CBG MONITORING, ED    ____________________________________________  EKG:  ________________________________________  RADIOLOGY All imaging, including plain films, CT scans, and ultrasounds, independently reviewed by me, and interpretations confirmed via formal radiology reads.  ED MD interpretation:     Official radiology  report(s): No results found.  ____________________________________________  PROCEDURES   Procedure(s) performed (including Critical Care):  Procedures  ____________________________________________  INITIAL IMPRESSION / MDM / [place] / ED COURSE  As part of my medical decision making, I reviewed the following data within the [facility] notes reviewed and incorporated, Old chart reviewed, Notes from prior ED visits, and [place] was evaluated in Emergency Department on 11/10/2019 for the symptoms described in the history of present illness. He  was evaluated in the context of the global COVID-19 pandemic, which necessitated consideration that the patient might be at risk for infection with the SARS-CoV-2 virus that causes COVID-19. Institutional protocols and algorithms that pertain to the evaluation of patients at risk for COVID-19 are in a state of rapid change based on information released by regulatory bodies including the CDC and federal and state organizations. These policies and algorithms were followed during the patient's care in the ED.  Some ED evaluations and interventions may be delayed as a result of limited staffing during the pandemic.*     Medical Decision Making: 54 year old male here with hyperglycemia due to running out of his Metformin and diabetic medications.  He is hyperglycemic here but with no ketones in the urine, normal bicarb, normal anion gap, no evidence to suggest DKA.  He has no other symptoms to suggest HHS or other hypoglycemic complication.  He is tolerating p.o. and would like to attempt p.o. hydration, with subcu insulin.  Will refill his Metformin.  Will refer him to PCP.  Return precautions given.  ____________________________________________  FINAL CLINICAL IMPRESSION(S) / ED DIAGNOSES  Final diagnoses:  Hyperglycemia     MEDICATIONS GIVEN DURING THIS  VISIT:  Medications  insulin aspart (novoLOG) injection 5 Units (5 Units Subcutaneous Given 11/10/19 0947)     ED Discharge Orders         Ordered    metFORMIN (GLUCOPHAGE) 1000 MG tablet  2 times daily with meals     11/10/19 1025           Note:  This document was prepared using Dragon voice recognition software and may include unintentional dictation errors.   Shaune Pollack, MD 11/10/19 1028

## 2020-02-21 ENCOUNTER — Telehealth: Payer: Self-pay | Admitting: General Practice

## 2020-02-21 NOTE — Telephone Encounter (Signed)
Individual has been contacted regarding ED referral and has been given information regarding the clinic. No further attempts to contact the individual will be made.

## 2020-08-20 ENCOUNTER — Other Ambulatory Visit: Payer: Self-pay

## 2020-08-20 ENCOUNTER — Encounter: Payer: Self-pay | Admitting: Emergency Medicine

## 2020-08-20 ENCOUNTER — Emergency Department
Admission: EM | Admit: 2020-08-20 | Discharge: 2020-08-20 | Disposition: A | Discharge status: Home / Self Care | Payer: BC Managed Care – PPO | Attending: Emergency Medicine | Admitting: Emergency Medicine

## 2020-08-20 ENCOUNTER — Emergency Department: Payer: BC Managed Care – PPO

## 2020-08-20 DIAGNOSIS — Z7984 Long term (current) use of oral hypoglycemic drugs: Secondary | ICD-10-CM | POA: Insufficient documentation

## 2020-08-20 DIAGNOSIS — S34109A Unspecified injury to unspecified level of lumbar spinal cord, initial encounter: Secondary | ICD-10-CM | POA: Diagnosis present

## 2020-08-20 DIAGNOSIS — S39012A Strain of muscle, fascia and tendon of lower back, initial encounter: Secondary | ICD-10-CM | POA: Insufficient documentation

## 2020-08-20 DIAGNOSIS — Y9389 Activity, other specified: Secondary | ICD-10-CM | POA: Insufficient documentation

## 2020-08-20 DIAGNOSIS — F17200 Nicotine dependence, unspecified, uncomplicated: Secondary | ICD-10-CM | POA: Diagnosis not present

## 2020-08-20 DIAGNOSIS — E119 Type 2 diabetes mellitus without complications: Secondary | ICD-10-CM | POA: Diagnosis not present

## 2020-08-20 DIAGNOSIS — X503XXA Overexertion from repetitive movements, initial encounter: Secondary | ICD-10-CM | POA: Insufficient documentation

## 2020-08-20 MED ORDER — ORPHENADRINE CITRATE ER 100 MG PO TB12: 100.0000 mg | ORAL_TABLET | Freq: Two times a day (BID) | ORAL | 0 refills | Status: DC

## 2020-08-20 MED ORDER — NAPROXEN 500 MG PO TABS: 500.0000 mg | ORAL_TABLET | Freq: Two times a day (BID) | ORAL | Status: DC

## 2020-08-20 NOTE — ED Provider Notes (Signed)
[facility] Emergency Department Provider Note   ____________________________________________   Event Date/Time   First MD Initiated Contact with Patient 08/20/20 1101     (approximate)  I have reviewed the triage vital signs and the nursing notes.   HISTORY  Chief Complaint Back Pain    HPI John [name] is a 55 y.o. male patient presents with 1 week of low back pain.  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  Patient denies radicular component to his back pain.  Patient denies bladder or bowel dysfunction.  Patient rates pain 7/10.  Patient described pain as "achy".  No palliative measure for complaint.         Past Medical History:  Diagnosis Date  . Diabetes mellitus without complication ([place])     There are no problems to display for this patient.   History reviewed. No pertinent surgical history.  Prior to Admission medications   Medication Sig Start Date End Date Taking? Authorizing Provider  naproxen (NAPROSYN) 500 MG tablet Take 1 tablet (500 mg total) by mouth 2 (two) times daily with a meal. 08/20/20  Yes Sable Feil, PA-C  orphenadrine (NORFLEX) 100 MG tablet Take 1 tablet (100 mg total) by mouth 2 (two) times daily. 08/20/20  Yes Sable Feil, PA-C  albuterol (PROVENTIL HFA;VENTOLIN HFA) 108 (90 Base) MCG/ACT inhaler Inhale 2 puffs into the lungs every 6 (six) hours as needed for wheezing or shortness of breath. 03/29/18   Laban Emperor, PA-C  metFORMIN (GLUCOPHAGE) 1000 MG tablet Take 1 tablet (1,000 mg total) by mouth 2 (two) times daily with a meal. 11/10/19 01/09/20  Duffy Bruce, MD  naproxen (NAPROSYN) 500 MG tablet Take 1 tablet (500 mg total) by mouth 2 (two) times daily with a meal. 07/04/19   Carrie Mew, MD    Allergies Black walnut flavor  No family history on file.  Social History Social History   Tobacco Use  . Smoking status: Current Every Day Smoker   . Smokeless tobacco: Never Used  Substance Use Topics  . Alcohol use: Not Currently  . Drug use: Not Currently    Review of Systems Constitutional: No fever/chills Eyes: No visual changes. ENT: No sore throat. Cardiovascular: Denies chest pain. Respiratory: Denies shortness of breath. Gastrointestinal: No abdominal pain.  No nausea, no vomiting.  No diarrhea.  No constipation. Genitourinary: Negative for dysuria. Musculoskeletal: Positive for back pain. Skin: Negative for rash. Neurological: Negative for headaches, focal weakness or numbness. Endocrine:  Diabetes Allergic/Immunilogical: Black walnut flavored ____________________________________________   PHYSICAL EXAM:  VITAL SIGNS: ED Triage Vitals  Enc Vitals Group     BP 08/20/20 1035 137/82     Pulse Rate 08/20/20 1035 87     Resp 08/20/20 1035 18     Temp 08/20/20 1035 98.9 F (37.2 C)     Temp Source 08/20/20 1035 Oral     SpO2 08/20/20 1035 97 %     Weight 08/20/20 1034 184 lb 15.5 oz (83.9 kg)     Height 08/20/20 1034 5\' 6"  (1.676 m)     Head Circumference --      Peak Flow --      Pain Score 08/20/20 1034 7     Pain Loc --      Pain Edu? --      Excl. in [place]? --    Constitutional: Alert and oriented. Well appearing and in no acute distress. Neck: No  cervical spine tenderness to palpation Hematological/Lymphatic/Immunilogical: No cervical lymphadenopathy. Cardiovascular: Normal rate, regular rhythm. Grossly normal heart sounds.  Good peripheral circulation. Respiratory: Normal respiratory effort.  No retractions. Lungs CTAB. Gastrointestinal: Soft and nontender. No distention. No abdominal bruits. No CVA tenderness. Musculoskeletal: No obvious lumbar spine deformity.  Patient decreased range of motion with right lateral movements.  Patient palpable right paraspinal muscle spasms.  Patient has negative straight leg test in supine position. Neurologic:  Normal speech and language. No gross focal neurologic  deficits are appreciated. No gait instability. Skin:  Skin is warm, dry and intact. No rash noted. Psychiatric: Mood and affect are normal. Speech and behavior are normal.  ____________________________________________   LABS (all labs ordered are listed, but only abnormal results are displayed)  Labs Reviewed - No data to display ____________________________________________  EKG   ____________________________________________  RADIOLOGY I, Joni Reining, personally viewed and evaluated these images (plain radiographs) as part of my medical decision making, as well as reviewing the written report by the radiologist.  ED MD interpretation:    Official radiology report(s): DG Lumbar Spine 2-3 Views  Result Date: 08/20/2020 CLINICAL DATA:  2 weeks of nontraumatic low back pain. EXAM: LUMBAR SPINE - 2-3 VIEW COMPARISON:  None. FINDINGS: Five non rib-bearing lumbar type vertebral bodies. Vertebral body heights are preserved. No fracture or focal osseous lesion. Disc spaces are grossly preserved. Minimal endplate and facet degenerative spurring. Soft tissues are within normal limits. : No acute osseous abnormality. Mild degenerative changes. Electronically Signed   By: Stana Bunting M.D.   On: 08/20/2020 11:45    ____________________________________________   PROCEDURES  Procedure(s) performed (including Critical Care):  Procedures   ____________________________________________   INITIAL IMPRESSION / ASSESSMENT AND PLAN / ED COURSE  As part of my medical decision making, I reviewed the following data within the electronic MEDICAL RECORD NUMBER         Patient presents with low back pain secondary to repetitive twisting and flexion.  Discussed no acute findings on x-ray of the lumbar spine with patient.  Patient complaining physical exam consistent with lumbar strain.  Patient given discharge care instructions and advised to purchase a lumbar spine elastic support to use when  working.  Take medication as directed.  Patient advised withdraws effects of muscle relaxers.  Patient [facility]      ____________________________________________   FINAL CLINICAL IMPRESSION(S) / ED DIAGNOSES  Final diagnoses:  Strain of lumbar region, initial encounter     ED Discharge Orders         Ordered    orphenadrine (NORFLEX) 100 MG tablet  2 times daily        08/20/20 1154    naproxen (NAPROSYN) 500 MG tablet  2 times daily with meals        08/20/20 1154          *Please note:  John [name] was evaluated in Emergency Department on 08/20/2020 for the symptoms described in the history of present illness. He was evaluated in the context of the global COVID-19 pandemic, which necessitated consideration that the patient might be at risk for infection with the SARS-CoV-2 virus that causes COVID-19. Institutional protocols and algorithms that pertain to the evaluation of patients at risk for COVID-19 are in a state of rapid change based on information released by regulatory bodies including the CDC and federal and state organizations. These policies and algorithms were followed during the patient's care in the ED.  Some ED evaluations and interventions may be delayed as a result of limited staffing during and the pandemic.*   Note:  This document was prepared using Dragon voice recognition software and may include unintentional dictation errors.    Joni Reining, PA-C 08/20/20 1158    Chesley Noon, MD 08/21/20 [phone]

## 2020-08-20 NOTE — Discharge Instructions (Addendum)
Follow discharge care instruction take medication as directed.  Do not take muscle relaxer while working or [employer], television/film set.

## 2020-08-20 NOTE — ED Triage Notes (Signed)
Patient to ER for c/o lower back pain x1 week. Denies injury. Denies urinary symptoms.

## 2020-10-19 ENCOUNTER — Emergency Department: Payer: BC Managed Care – PPO

## 2020-10-19 ENCOUNTER — Other Ambulatory Visit: Payer: Self-pay

## 2020-10-19 ENCOUNTER — Emergency Department
Admission: EM | Admit: 2020-10-19 | Discharge: 2020-10-19 | Disposition: A | Discharge status: Home / Self Care | Payer: BC Managed Care – PPO | Attending: Emergency Medicine | Admitting: Emergency Medicine

## 2020-10-19 DIAGNOSIS — F17210 Nicotine dependence, cigarettes, uncomplicated: Secondary | ICD-10-CM | POA: Insufficient documentation

## 2020-10-19 DIAGNOSIS — Z7984 Long term (current) use of oral hypoglycemic drugs: Secondary | ICD-10-CM | POA: Insufficient documentation

## 2020-10-19 DIAGNOSIS — S8001XA Contusion of right knee, initial encounter: Secondary | ICD-10-CM | POA: Insufficient documentation

## 2020-10-19 DIAGNOSIS — E119 Type 2 diabetes mellitus without complications: Secondary | ICD-10-CM | POA: Diagnosis not present

## 2020-10-19 DIAGNOSIS — W01198A Fall on same level from slipping, tripping and stumbling with subsequent striking against other object, initial encounter: Secondary | ICD-10-CM | POA: Diagnosis not present

## 2020-10-19 DIAGNOSIS — M25461 Effusion, right knee: Secondary | ICD-10-CM | POA: Insufficient documentation

## 2020-10-19 DIAGNOSIS — S8991XA Unspecified injury of right lower leg, initial encounter: Secondary | ICD-10-CM | POA: Diagnosis present

## 2020-10-19 LAB — SYNOVIAL CELL COUNT + DIFF, W/ CRYSTALS
Eosinophils-Synovial: 0 %
Lymphocytes-Synovial Fld: 2 %
Monocyte-Macrophage-Synovial Fluid: 9 %
Neutrophil, Synovial: 89 %
WBC, Synovial: 99062 /mm3 — ABNORMAL HIGH (ref 0–200)

## 2020-10-19 MED ORDER — TRIAMCINOLONE ACETONIDE 40 MG/ML IJ SUSP
40.0000 mg | Freq: Once | INTRAMUSCULAR | Status: DC
  Filled 2020-10-19: qty 1

## 2020-10-19 MED ORDER — LIDOCAINE HCL (PF) 1 % IJ SOLN
5.0000 mL | Freq: Once | INTRAMUSCULAR | Status: DC
  Filled 2020-10-19: qty 5

## 2020-10-19 MED ORDER — LIDOCAINE HCL (PF) 1 % IJ SOLN
2.0000 mL | Freq: Once | INTRAMUSCULAR | Status: AC
  Administered 2020-10-19: 2 mL
  Filled 2020-10-19: qty 5

## 2020-10-19 MED ORDER — HYDROCODONE-ACETAMINOPHEN 5-325 MG PO TABS: 1.0000 | ORAL_TABLET | Freq: Four times a day (QID) | ORAL | 0 refills | Status: DC | PRN

## 2020-10-19 MED ORDER — HYDROCODONE-ACETAMINOPHEN 5-325 MG PO TABS
1.0000 | ORAL_TABLET | ORAL | Status: AC
  Administered 2020-10-19: 1 via ORAL
  Filled 2020-10-19: qty 1

## 2020-10-19 NOTE — ED Triage Notes (Signed)
Pt states he is having right knee pain after hitting knee on piece of metal, pt states he already has chronic issues with his knees being sore and this made it worse. Pt able to bear minimum weight on leg.

## 2020-10-19 NOTE — ED Provider Notes (Signed)
[facility] EMERGENCY DEPARTMENT Provider Note   CSN: [id number] Arrival date & time: 10/19/20  1843     History Chief Complaint  Patient presents with  . Knee Pain    John [name] is a 55 y.o. male presents emerge department evaluation of right knee pain.  Patient had a mechanical fall several days ago, fell up against a large piece of metal bumping the right lateral aspect of his knee.  Healing abrasion/superficial wound seen.  Patient's had increased swelling throughout the knee, having a hard time walking.  Denies any groin or ankle pain.  No numbness or tingling.  No other injury to his body.  No head injury, LOC.  No fevers.  No calf pain.  Patient with history of gout.  HPI     Past Medical History:  Diagnosis Date  . Diabetes mellitus without complication (HCC)     There are no problems to display for this patient.   History reviewed. No pertinent surgical history.     No family history on file.  Social History   Tobacco Use  . Smoking status: Current Every Day Smoker    Packs/day: 0.50    Types: Cigarettes  . Smokeless tobacco: Never Used  Substance Use Topics  . Alcohol use: Not Currently  . Drug use: Not Currently    Home Medications Prior to Admission medications   Medication Sig Start Date End Date Taking? Authorizing Provider  HYDROcodone-acetaminophen (NORCO) 5-325 MG tablet Take 1 tablet by mouth every 6 (six) hours as needed for moderate pain. 10/19/20  Yes Evon Slack, PA-C  albuterol (PROVENTIL HFA;VENTOLIN HFA) 108 (90 Base) MCG/ACT inhaler Inhale 2 puffs into the lungs every 6 (six) hours as needed for wheezing or shortness of breath. 03/29/18   Enid Derry, PA-C  metFORMIN (GLUCOPHAGE) 1000 MG tablet Take 1 tablet (1,000 mg total) by mouth 2 (two) times daily with a meal. 11/10/19 01/09/20  Shaune Pollack, MD  naproxen (NAPROSYN) 500 MG tablet Take 1 tablet (500 mg total) by mouth 2 (two) times daily with a meal.  07/04/19   Sharman Cheek, MD  naproxen (NAPROSYN) 500 MG tablet Take 1 tablet (500 mg total) by mouth 2 (two) times daily with a meal. 08/20/20   Joni Reining, PA-C  orphenadrine (NORFLEX) 100 MG tablet Take 1 tablet (100 mg total) by mouth 2 (two) times daily. 08/20/20   Joni Reining, PA-C    Allergies    Black walnut flavor  Review of Systems   Review of Systems  Constitutional: Negative for fever.  Musculoskeletal: Positive for arthralgias and gait problem. Negative for joint swelling.  Skin: Negative for rash and wound.  Neurological: Negative for dizziness, numbness and headaches.    Physical Exam Updated Vital Signs BP (!) 144/90 (BP Location: Right Arm)   Pulse 65   Temp 99.3 F (37.4 C)   Resp 16   Ht 5\' 6"  (1.676 m)   Wt 80.7 kg   SpO2 97%   BMI 28.73 kg/m   Physical Exam Constitutional:      Appearance: He is well-developed and well-nourished.  HENT:     Head: Normocephalic and atraumatic.  Eyes:     Conjunctiva/sclera: Conjunctivae normal.  Cardiovascular:     Rate and Rhythm: Normal rate.  Pulmonary:     Effort: Pulmonary effort is normal. No respiratory distress.  Musculoskeletal:        General: Normal range of motion.     Cervical back: Normal  range of motion.     Comments: Examination right lower extremity shows patient has good range of motion of the hip with internal X rotation with no discomfort.  Patella nontender.  Patient tender along the lateral tibial plateau.  Knee stable to valgus varus stress testing.  He is able to maintain active extension, no palpable defect in the patellar tendon or quad tendon.  No skin breakdown noted.  Moderate effusion.  Range of motion 0 to 95 degrees neurovascular intact in right lower extremity.  Negative Homans' sign bilaterally.  Skin:    General: Skin is warm.     Findings: No rash.     Comments: Healing wound to the lateral aspect of the right knee, 1 cm diameter.  No drainage  Neurological:     Mental  Status: He is alert and oriented to person, place, and time.  Psychiatric:        Mood and Affect: Mood and affect normal.        Behavior: Behavior normal.        Thought Content: Thought content normal.     ED Results / Procedures / Treatments   Labs (all labs ordered are listed, but only abnormal results are displayed) Labs Reviewed  SYNOVIAL CELL COUNT + DIFF, W/ CRYSTALS - Abnormal; Notable for the following components:      Result Value   Color, Synovial YELLOW (*)    Appearance-Synovial HAZY (*)    WBC, Synovial 99,062 (*)    All other components within normal limits  BODY FLUID CULTURE W GRAM STAIN  CBC  BASIC METABOLIC PANEL  SEDIMENTATION RATE    EKG None  Radiology DG Knee Complete 4 Views Right  Result Date: 10/19/2020 CLINICAL DATA:  Right knee pain. Acute on chronic pain after striking knee on piece of metal. EXAM: RIGHT KNEE - COMPLETE 4+ VIEW COMPARISON:  Knee radiograph 10/14/2019 FINDINGS: No acute fracture or dislocation. Mild peripheral spurring. Faint chondrocalcinosis of the lateral tibiofemoral compartment. There is a moderate suprapatellar knee joint effusion. Mild generalized soft tissue edema. IMPRESSION: 1. No acute fracture or dislocation. 2. Moderate suprapatellar knee joint effusion, similar to prior exam. 3. Mild osteoarthritis and chondrocalcinosis. Electronically Signed   By: Narda Rutherford M.D.   On: 10/19/2020 19:51    Procedures .Joint Aspiration/Arthrocentesis  Date/Time: 10/19/2020 10:44 PM Performed by: Evon Slack, PA-C Authorized by: Evon Slack, PA-C   Consent:    Consent obtained:  Verbal   Consent given by:  Patient Location:    Location:  Knee   Knee:  R knee Anesthesia:    Anesthesia method:  Local infiltration   Local anesthetic:  Lidocaine 1% w/o epi Procedure details:    Preparation: Patient was prepped and draped in usual sterile fashion     Needle gauge:  18 G   Ultrasound guidance: no     Approach:   Superior   Aspirate characteristics:  Cloudy   Steroid injected: no     Specimen collected: yes   Post-procedure details:    Dressing:  Sterile dressing   Procedure completion:  Tolerated     Medications Ordered in ED Medications  HYDROcodone-acetaminophen (NORCO/VICODIN) 5-325 MG per tablet 1 tablet (1 tablet Oral Given 10/19/20 1930)  lidocaine (PF) (XYLOCAINE) 1 % injection 2 mL (2 mLs Infiltration Given 10/19/20 2233)    ED Course  I have reviewed the triage vital signs and the nursing notes.  Pertinent labs & imaging results that were available during my  care of the patient were reviewed by me and considered in my medical decision making (see chart for details).    MDM Rules/Calculators/A&P                          55 year old male with right knee pain.  Patient had a fall several days ago.  Mild wound to the lateral aspect of the knee, does not appear to be infected nor very deep.  Patient with large effusion, no warmth or redness throughout the knee.  Patient with a history of gout.  X-rays of the knee show no evidence of acute bony abnormality.  Right knee effusion aspirated.  Synovial fluid aspirated from the knee seem to be consistent with gout, slightly hazy yellow synovial fluid.  Synovial fluid sent to the lab for cell count and culture.  There appear to be no organisms seen on the Gram stain but there was a high white count of 99,000 with no signs of crystals present.  There appeared to be a high neutrophil shift.  Patient's synovial fluid cell count concerning for possible septic joint.  Recommended starting IV and getting blood work and discussed consulting orthopedics for possible admission.  Patient refused blood work and stated he was going to go home.  He states that his knee was feeling significantly better with improved range of motion and he did not feel as if he had an infection.  Patient urged to stay and allow for [place] to continue to work him up due to suspicion for  septic joint.  I described the complications of septic joint to the patient as well as secondary complications.  Patient is adamant that he is going to go home and that he will come back if he has any increasing pain or swelling.  Patient left AMA.    Final Clinical Impression(s) / ED Diagnoses Final diagnoses:  Effusion of right knee  Contusion of right knee, initial encounter    Rx / DC Orders ED Discharge Orders         Ordered    HYDROcodone-acetaminophen (NORCO) 5-325 MG tablet  Every 6 hours PRN        10/19/20 2337           Ronnette Juniper 10/19/20 2357    Sharman Cheek, MD 10/20/20 2355

## 2020-10-19 NOTE — Discharge Instructions (Addendum)
The fluid withdrawn from your right knee is concerning for an infection.  Please return to the emergency department immediately for any increased pain, fevers, worsening symptoms or urgent changes in your health.

## 2020-10-19 NOTE — ED Notes (Signed)
Ptr has agreed to sign out AMA. Pt was told to stay by PA, pt still insisted on leaving. AMA form printed and signed.

## 2020-10-19 NOTE — ED Notes (Signed)
Pillow placed under right knee for comfort.  

## 2020-10-20 DIAGNOSIS — M25561 Pain in right knee: Secondary | ICD-10-CM | POA: Diagnosis not present

## 2020-10-20 DIAGNOSIS — Z5321 Procedure and treatment not carried out due to patient leaving prior to being seen by health care provider: Secondary | ICD-10-CM | POA: Diagnosis not present

## 2020-10-20 DIAGNOSIS — W228XXA Striking against or struck by other objects, initial encounter: Secondary | ICD-10-CM | POA: Diagnosis not present

## 2020-10-21 ENCOUNTER — Other Ambulatory Visit: Payer: Self-pay

## 2020-10-21 ENCOUNTER — Emergency Department
Admission: EM | Admit: 2020-10-21 | Discharge: 2020-10-21 | Disposition: A | Discharge status: Home / Self Care | Payer: BC Managed Care – PPO | Attending: Emergency Medicine | Admitting: Emergency Medicine

## 2020-10-21 ENCOUNTER — Encounter: Payer: Self-pay | Admitting: Emergency Medicine

## 2020-10-21 NOTE — ED Triage Notes (Signed)
Pt reports he is having Right knee pain after hitting knee on piece of metal, pt reports was here last night and did not want to stay per discharge instructions fluid drained  From knee concerning with infection. Pt reports today pain increased. Pt talks in complete sentences no distress noted

## 2020-10-23 LAB — BODY FLUID CULTURE W GRAM STAIN
Culture: NO GROWTH
Gram Stain: NONE SEEN

## 2020-11-06 ENCOUNTER — Emergency Department
Admission: EM | Admit: 2020-11-06 | Discharge: 2020-11-06 | Disposition: A | Discharge status: Home / Self Care | Payer: BC Managed Care – PPO | Attending: Emergency Medicine | Admitting: Emergency Medicine

## 2020-11-06 ENCOUNTER — Other Ambulatory Visit: Payer: Self-pay

## 2020-11-06 ENCOUNTER — Emergency Department: Payer: BC Managed Care – PPO

## 2020-11-06 DIAGNOSIS — Z79899 Other long term (current) drug therapy: Secondary | ICD-10-CM | POA: Insufficient documentation

## 2020-11-06 DIAGNOSIS — Z7984 Long term (current) use of oral hypoglycemic drugs: Secondary | ICD-10-CM | POA: Insufficient documentation

## 2020-11-06 DIAGNOSIS — M79661 Pain in right lower leg: Secondary | ICD-10-CM | POA: Diagnosis not present

## 2020-11-06 DIAGNOSIS — F17210 Nicotine dependence, cigarettes, uncomplicated: Secondary | ICD-10-CM | POA: Insufficient documentation

## 2020-11-06 DIAGNOSIS — E119 Type 2 diabetes mellitus without complications: Secondary | ICD-10-CM | POA: Insufficient documentation

## 2020-11-06 DIAGNOSIS — M25561 Pain in right knee: Secondary | ICD-10-CM | POA: Diagnosis not present

## 2020-11-06 MED ORDER — IBUPROFEN 800 MG PO TABS
800.0000 mg | ORAL_TABLET | Freq: Once | ORAL | Status: AC
  Administered 2020-11-06: 800 mg via ORAL
  Filled 2020-11-06: qty 1

## 2020-11-06 NOTE — ED Triage Notes (Signed)
Pt arrives to ER c/o R knee pain after getting hit by some metal at work. When asked about WC, he did not know whether he wants to file that at this time. R knee swollen but ambulatory on it.

## 2020-11-06 NOTE — Discharge Instructions (Signed)
Continue to take Meloxicam once daily for pain and inflammation.

## 2020-11-06 NOTE — ED Provider Notes (Signed)
[facility]-EMERGENCY DEPARTMENT  ____________________________________________  Time seen: Approximately 3:19 PM  I have reviewed the triage vital signs and the nursing notes.   HISTORY  Chief Complaint Knee Pain   Historian Patient     HPI John [name] is a 55 y.o. male with a history of diabetes, presents to the emergency department with pain and swelling of the right knee.  Patient states that he fell around 10/17/2020 and bumped his knee against a large piece of metal.  Patient has had 2 arthrocentesis procedure since injury occurred which shows an inflammatory process without signs of septic knee.  Patient reports that his pain is mostly localized to his calf.  He denies numbness or tingling of the lower extremity.  He states that pain is worse with prolonged weightbearing.  He has not followed up with orthopedics yet.  Past Medical History:  Diagnosis Date  . Diabetes mellitus without complication (HCC)      Immunizations up to date:  Yes.     Past Medical History:  Diagnosis Date  . Diabetes mellitus without complication (HCC)     There are no problems to display for this patient.   History reviewed. No pertinent surgical history.  Prior to Admission medications   Medication Sig Start Date End Date Taking? Authorizing Provider  albuterol (PROVENTIL HFA;VENTOLIN HFA) 108 (90 Base) MCG/ACT inhaler Inhale 2 puffs into the lungs every 6 (six) hours as needed for wheezing or shortness of breath. 03/29/18   Enid Derry, PA-C  metFORMIN (GLUCOPHAGE) 1000 MG tablet Take 1 tablet (1,000 mg total) by mouth 2 (two) times daily with a meal. 11/10/19 01/09/20  Shaune Pollack, MD  naproxen (NAPROSYN) 500 MG tablet Take 1 tablet (500 mg total) by mouth 2 (two) times daily with a meal. 08/20/20   Joni Reining, PA-C  orphenadrine (NORFLEX) 100 MG tablet Take 1 tablet (100 mg total) by mouth 2 (two) times daily. 08/20/20   Joni Reining, PA-C    Allergies Black walnut  flavor  History reviewed. No pertinent family history.  Social History Social History   Tobacco Use  . Smoking status: Current Every Day Smoker    Packs/day: 0.50    Types: Cigarettes  . Smokeless tobacco: Never Used  Substance Use Topics  . Alcohol use: Not Currently  . Drug use: Not Currently     Review of Systems  Constitutional: No fever/chills Eyes:  No discharge ENT: No upper respiratory complaints. Respiratory: no cough. No SOB/ use of accessory muscles to breath Gastrointestinal:   No nausea, no vomiting.  No diarrhea.  No constipation. Musculoskeletal: Patient has right knee pain and right lower leg pain.  Skin: Negative for rash, abrasions, lacerations, ecchymosis.   ____________________________________________   PHYSICAL EXAM:  VITAL SIGNS: ED Triage Vitals [11/06/20 1417]  Enc Vitals Group     BP (!) 150/82     Pulse Rate 88     Resp 18     Temp 98.1 F (36.7 C)     Temp Source Oral     SpO2 98 %     Weight 178 lb (80.7 kg)     Height 5\' 6"  (1.676 m)     Head Circumference      Peak Flow      Pain Score 8     Pain Loc      Pain Edu?      Excl. in GC?      Constitutional: Alert and oriented. Well appearing and in no acute  distress. Eyes: Conjunctivae are normal. PERRL. EOMI. Head: Atraumatic. ENT:  Cardiovascular: Normal rate, regular rhythm. Normal S1 and S2.  Good peripheral circulation. Respiratory: Normal respiratory effort without tachypnea or retractions. Lungs CTAB. Good air entry to the bases with no decreased or absent breath sounds Gastrointestinal: Bowel sounds x 4 quadrants. Soft and nontender to palpation. No guarding or rigidity. No distention. Musculoskeletal: Patient performs limited range of motion at the right knee.  Right knee does have loss of peripatellar dimpling and large effusion.  Patient does have some distended veins visualized along right lower extremity.  Palpable dorsalis pedis pulse, right. Neurologic:  Normal for  age. No gross focal neurologic deficits are appreciated.  Skin:  Skin is warm, dry and intact. No rash noted. Psychiatric: Mood and affect are normal for age. Speech and behavior are normal.   ____________________________________________   LABS (all labs ordered are listed, but only abnormal results are displayed)  Labs Reviewed - No data to display ____________________________________________  EKG   ____________________________________________  RADIOLOGY Geraldo Pitter, personally viewed and evaluated these images (plain radiographs) as part of my medical decision making, as well as reviewing the written report by the radiologist.    DG Tibia/Fibula Right  Result Date: 11/06/2020 CLINICAL DATA:  Leg pain. EXAM: RIGHT TIBIA AND FIBULA - 2 VIEW COMPARISON:  Right knee 10/19/2020. FINDINGS: Prominent knee joint effusion again noted. Mild degenerative changes with chondrocalcinosis about the right knee again noted. Right tib fib intact. No acute bony or joint abnormality identified. IMPRESSION: 1. Prominent knee joint effusion again noted. 2. Mild degenerative changes with chondrocalcinosis about the right knee again noted. No acute bony abnormality. Right tib fib intact. Electronically Signed   By: Maisie Fus  Register   On: 11/06/2020 15:59   US Venous Img Lower Unilateral Right  Result Date: 11/06/2020 CLINICAL DATA:  Right calf swelling EXAM: RIGHT LOWER EXTREMITY VENOUS DOPPLER ULTRASOUND TECHNIQUE: Gray-scale sonography with compression, as well as color and duplex ultrasound, were performed to evaluate the deep venous system(s) from the level of the common femoral vein through the popliteal and proximal calf veins. COMPARISON:  None. FINDINGS: VENOUS Normal compressibility of the common femoral, superficial femoral, and popliteal veins, as well as the visualized calf veins. Visualized portions of profunda femoral vein and great saphenous vein unremarkable. No filling defects to suggest  DVT on grayscale or color Doppler imaging. Doppler waveforms show normal direction of venous flow, normal respiratory plasticity and response to augmentation. Limited views of the contralateral common femoral vein are unremarkable. OTHER None. Limitations: none IMPRESSION: Negative. Electronically Signed   By: Helyn Numbers MD   On: 11/06/2020 17:24   DG Knee Complete 4 Views Right  Result Date: 11/06/2020 CLINICAL DATA:  Workplace injury.  Knee pain. EXAM: RIGHT KNEE - COMPLETE 4+ VIEW COMPARISON:  10/19/2020. FINDINGS: Prominent knee joint effusion again noted. Mild tricompartment degenerative change with chondrocalcinosis again noted. No acute bony abnormality identified. No evidence of fracture or dislocation. IMPRESSION: 1. Prominent knee joint effusion again noted. 2. Mild tricompartment degenerative change with chondrocalcinosis again noted. No acute bony abnormality identified. Exam appears similar to prior study of 10/19/2020. Electronically Signed   By: Maisie Fus  Register   On: 11/06/2020 15:55    ____________________________________________    PROCEDURES  Procedure(s) performed:     Procedures     Medications  ibuprofen (ADVIL) tablet 800 mg (800 mg Oral Given 11/06/20 1750)     ____________________________________________   INITIAL IMPRESSION / ASSESSMENT AND PLAN /  ED COURSE  Pertinent labs & imaging results that were available during my care of the patient were reviewed by me and considered in my medical decision making (see chart for details).      Assessment and Plan:  Knee pain 55 year old male presents to the emergency department with acute on chronic right knee pain that has occurred for the past month.  Patient was hypertensive at triage but vital signs were otherwise reassuring.  This is patient's third emergency department encounter for right knee pain and has not yet followed up with orthopedics.  Patient had no bony abnormality visualized on x-rays of the  right knee or the right tibia/fibula.  Venous ultrasound showed no signs of DVT.  Declined patient's request for additional arthrocentesis as patient has had 2 prior arthrocentesis procedures.  Recommended continuing meloxicam and following up with orthopedics.    ____________________________________________  FINAL CLINICAL IMPRESSION(S) / ED DIAGNOSES  Final diagnoses:  Acute pain of right knee      NEW MEDICATIONS STARTED DURING THIS VISIT:  ED Discharge Orders    None          This chart was dictated using voice recognition software/Dragon. Despite best efforts to proofread, errors can occur which can change the meaning. Any change was purely unintentional.     Orvil Feil, PA-C 11/06/20 1802    Gilles Chiquito, MD 11/09/20 1002
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
## 2020-11-14 IMAGING — DX DG WRIST COMPLETE 3+V*R*
4 series · 4 of 4 positions shown · non-contrast
Comparison: None.

CLINICAL DATA: Pain and swelling of the right wrist for 1 week. No
known injury.

EXAM:
RIGHT WRIST - COMPLETE 3+ VIEW

[wrist ap (1 of 2)]
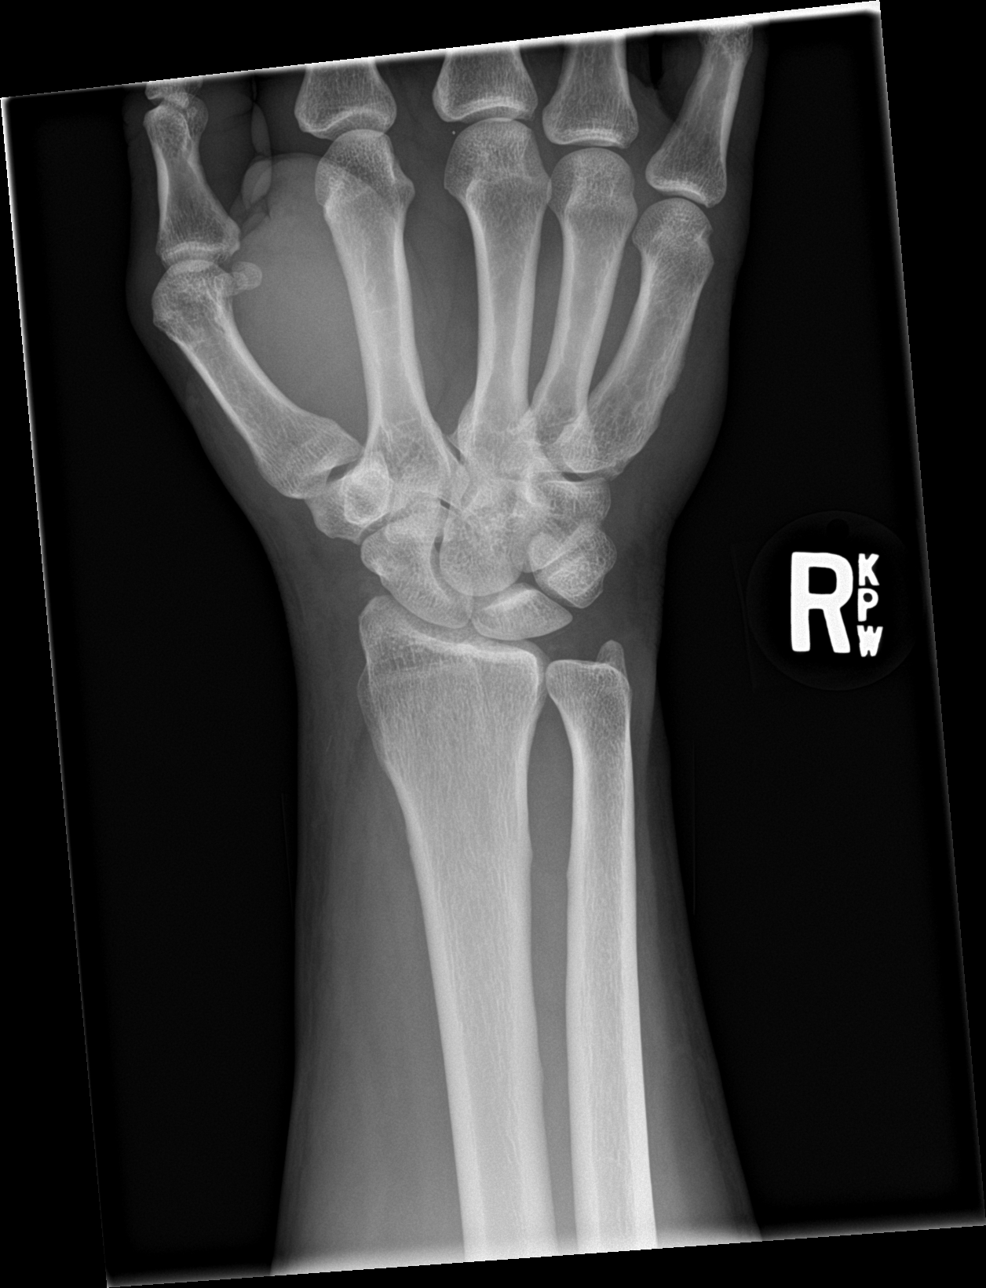

[wrist lat]
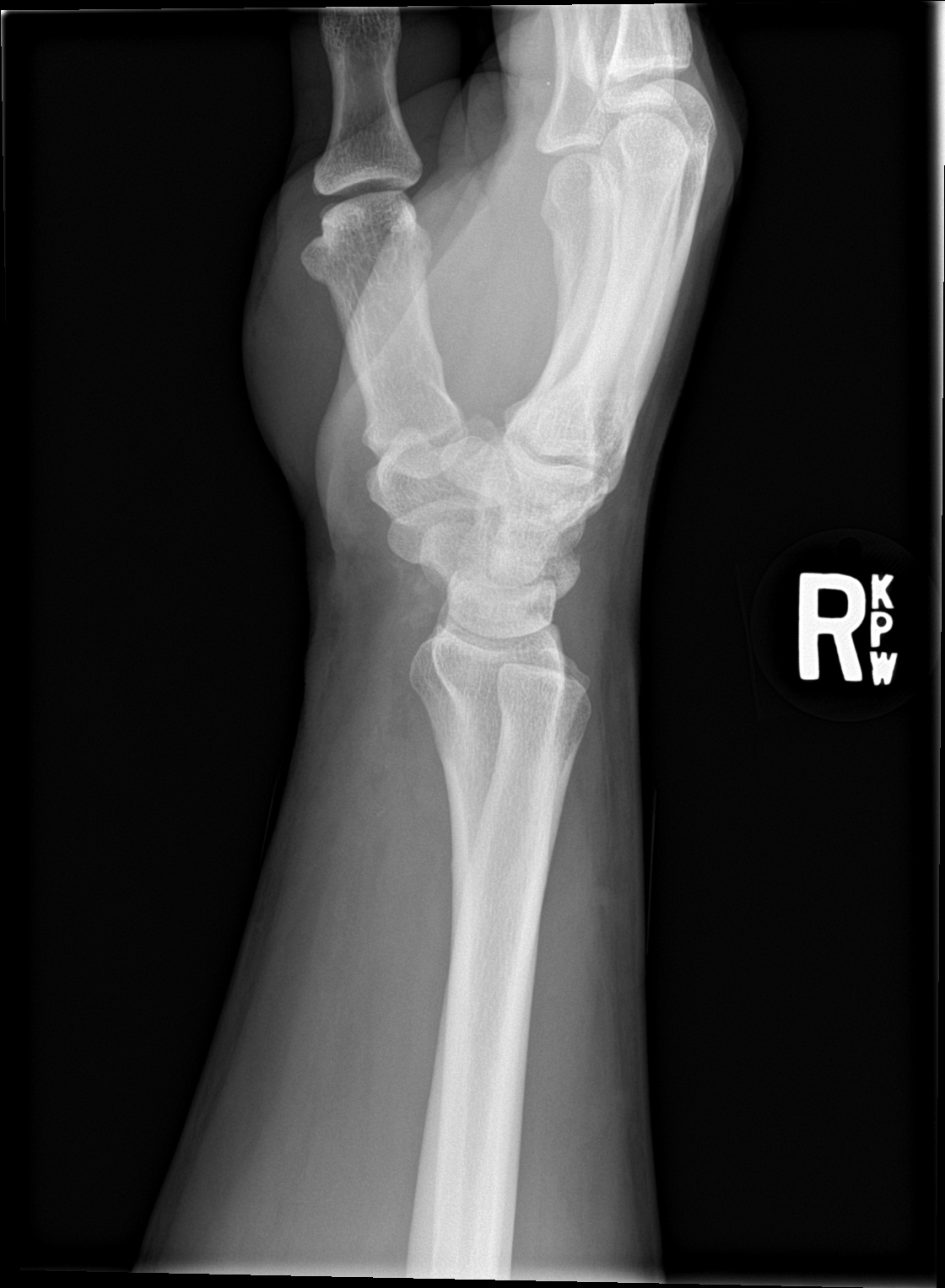

[wrist ap (2 of 2)]
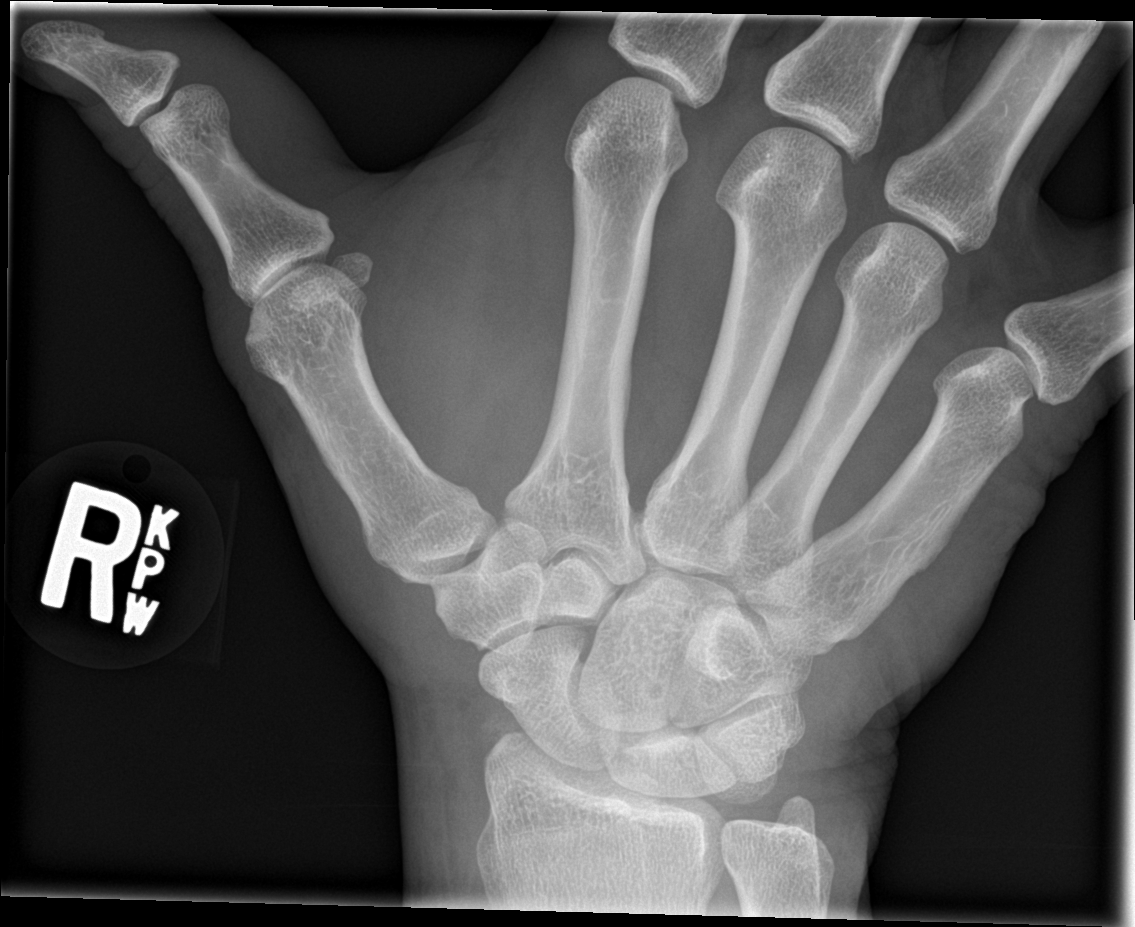

[wrist obl]
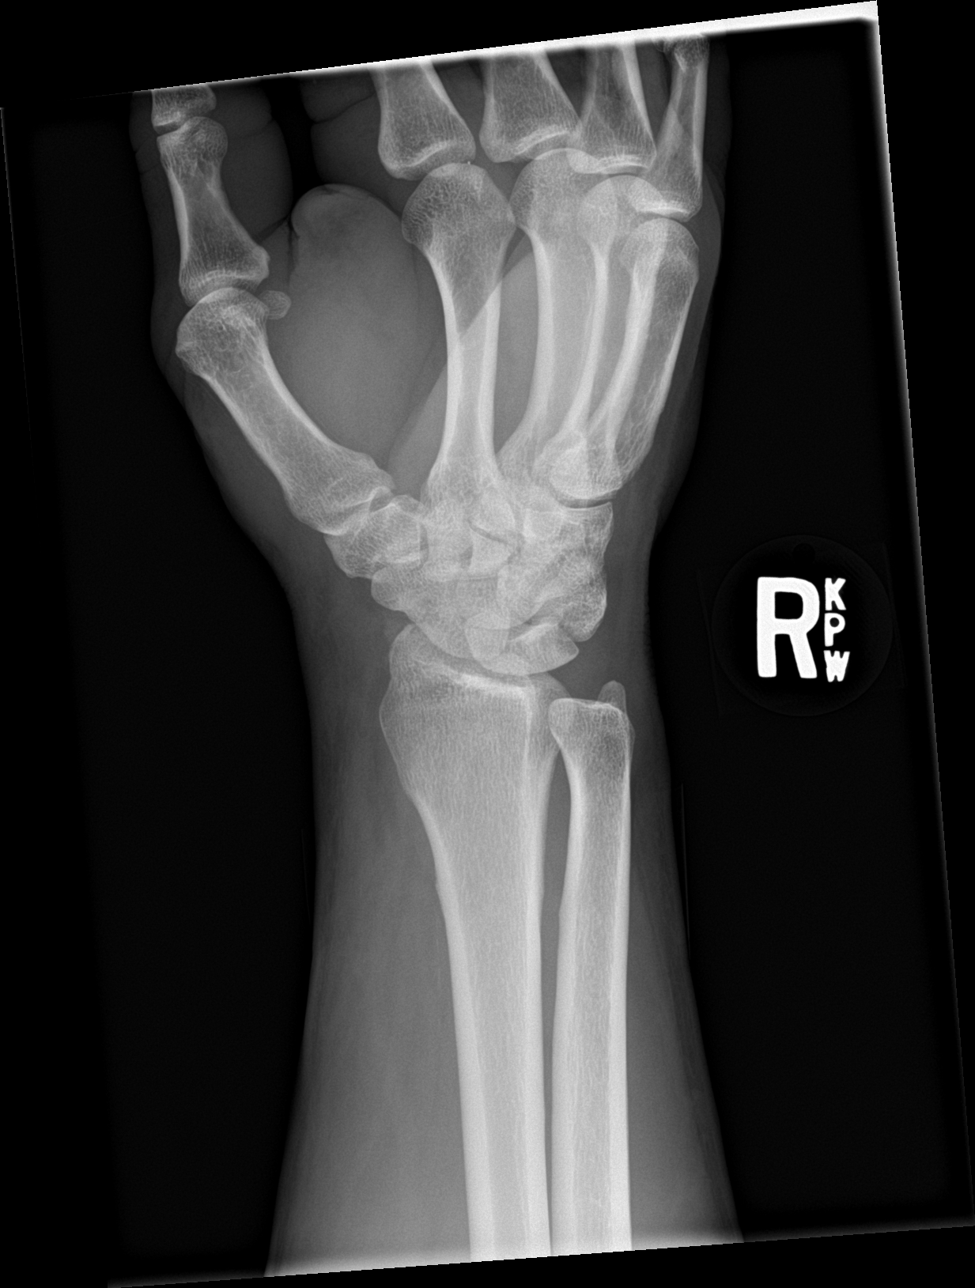

[4 of 4 positions shown; findings below may reference images not displayed]

FINDINGS: There is no evidence of fracture or dislocation. There is no
evidence of arthropathy or other focal bone abnormality. Soft
tissues are unremarkable.
IMPRESSION: Normal exam.

## 2021-09-27 ENCOUNTER — Other Ambulatory Visit: Payer: Self-pay

## 2021-09-27 ENCOUNTER — Encounter: Payer: Self-pay | Admitting: Emergency Medicine

## 2021-09-27 ENCOUNTER — Emergency Department
Admission: EM | Admit: 2021-09-27 | Discharge: 2021-09-27 | Disposition: A | Discharge status: Home / Self Care | Payer: BC Managed Care – PPO | Attending: Emergency Medicine | Admitting: Emergency Medicine

## 2021-09-27 ENCOUNTER — Emergency Department: Payer: BC Managed Care – PPO

## 2021-09-27 DIAGNOSIS — M545 Low back pain, unspecified: Secondary | ICD-10-CM | POA: Diagnosis present

## 2021-09-27 DIAGNOSIS — M542 Cervicalgia: Secondary | ICD-10-CM | POA: Diagnosis not present

## 2021-09-27 DIAGNOSIS — Z8616 Personal history of COVID-19: Secondary | ICD-10-CM | POA: Insufficient documentation

## 2021-09-27 DIAGNOSIS — R059 Cough, unspecified: Secondary | ICD-10-CM | POA: Insufficient documentation

## 2021-09-27 DIAGNOSIS — M7918 Myalgia, other site: Secondary | ICD-10-CM

## 2021-09-27 DIAGNOSIS — E119 Type 2 diabetes mellitus without complications: Secondary | ICD-10-CM | POA: Insufficient documentation

## 2021-09-27 DIAGNOSIS — F17210 Nicotine dependence, cigarettes, uncomplicated: Secondary | ICD-10-CM | POA: Diagnosis not present

## 2021-09-27 DIAGNOSIS — R0981 Nasal congestion: Secondary | ICD-10-CM | POA: Diagnosis not present

## 2021-09-27 MED ORDER — ETODOLAC 400 MG PO TABS: 400.0000 mg | ORAL_TABLET | Freq: Two times a day (BID) | ORAL | 0 refills | Status: DC

## 2021-09-27 MED ORDER — DEXAMETHASONE SODIUM PHOSPHATE 10 MG/ML IJ SOLN
10.0000 mg | Freq: Once | INTRAMUSCULAR | Status: AC
  Administered 2021-09-27: 10 mg via INTRAMUSCULAR
  Filled 2021-09-27: qty 1

## 2021-09-27 NOTE — Discharge Instructions (Addendum)
Call make an appoint with your primary care provider if any continued problems.  A prescription was sent to the pharmacy for you to take for muscle skeletal pain.  Take this medicine twice a day with food.  Discontinue taking it if you develop any stomach pain.  Discontinue taking your ibuprofen at this time.  Also the steroid shot that you received while in the emergency department could cause your blood sugar to elevate temporarily.  Watch what you are eating to reduce elevation.  May also use ice or heat to your muscles as needed for discomfort.  X-ray was negative for pneumonia.

## 2021-09-27 NOTE — ED Triage Notes (Signed)
Pt here with left side neck and lower back pain. Pt had covid a month ago and he has been having pain since. Pt states that when he coughs he hurts a lot in his back. Pt in NAD in triage.

## 2021-09-27 NOTE — ED Provider Notes (Signed)
[facility] Provider Note    Event Date/Time   First MD Initiated Contact with Patient 09/27/21 [phone]     (approximate)   History   Neck Pain   HPI  John [name] is a 56 y.o. male   presents to the ED with complaint of left-sided neck pain and low back pain that has been going on for several weeks.  Patient states that he had COVID a month ago and has continued to have pain since that time.  Patient also reports when he coughs it increases the pain in his back.  He is unaware of any fever and denies chills.  Patient does smoke less than half pack cigarettes per day and has history of diabetes mellitus taking Glucophage without any difficulties.  Patient has taken over-the-counter medication infrequently without any relief.  Patient rates his pain as 9/10.      Physical Exam   Triage Vital Signs: ED Triage Vitals [09/27/21 0721]  Enc Vitals Group     BP 130/87     Pulse Rate 90     Resp 16     Temp 98.7 F (37.1 C)     Temp Source Oral     SpO2 100 %     Weight 178 lb (80.7 kg)     Height 5\' 5"  (1.651 m)     Head Circumference      Peak Flow      Pain Score 9     Pain Loc      Pain Edu?      Excl. in GC?     Most recent vital signs: Vitals:   09/27/21 0721  BP: 130/87  Pulse: 90  Resp: 16  Temp: 98.7 F (37.1 C)  SpO2: 100%     General: Awake, no distress.  Ambulatory, talkative and able to speak in complete sentences without any noticeable shortness of breath. CV:  Good peripheral perfusion.  Heart regular rate and rhythm without murmur. Resp:  Normal effort.  Lungs are clear bilaterally. Abd:  No distention.  Other:  Minor generalized tenderness on palpation of the left trapezius muscles and lower lumbar paravertebral muscles.  No evidence of deformity or recent injury noted.  Range of motion without restriction and patient is ambulatory without any assistance.   ED Results / Procedures / Treatments   Labs (all labs ordered are  listed, but only abnormal results are displayed) Labs Reviewed - No data to display   RADIOLOGY Chest x-ray was reviewed by me and no evidence of infiltrate was seen.  Radiology report is negative for any acute cardiopulmonary disease.    PROCEDURES:  Critical Care performed:   Procedures   MEDICATIONS ORDERED IN ED: Medications  dexamethasone (DECADRON) injection 10 mg (10 mg Intramuscular Given 09/27/21 0921)     IMPRESSION / MDM / ASSESSMENT AND PLAN / ED COURSE  I reviewed the triage vital signs and the nursing notes.   Differential diagnosis includes, but is not limited to, musculoskeletal pain, muscle strain, pneumonia secondary to COVID  56 year old male presents to the ED with complaint of cough, congestion along with with left-sided neck pain and back pain.  Patient has history of COVID 1 month ago and began having back pain at that time.  Patient is a smoker and has history of diabetes which he takes metformin.  Physical exam showed muscle tenderness palpation generalized left trapezius rhomboid muscle and generalized muscle skeletal pain of the back.  Chest x-ray was  obtained to rule out pneumonia after having had COVID.  This was reviewed and radiology report is negative for acute cardiopulmonary disease.  Patient was reassured.  He was given an injection of Decadron 10 mg IM to help with his muscular skeletal pain and discharged with a prescription for etodolac 400 mg twice daily with food.  He is encouraged to follow-up with his PCP if any continued problems.  Use ice or moist heat to his muscles as needed for discomfort.   FINAL CLINICAL IMPRESSION(S) / ED DIAGNOSES   Final diagnoses:  Musculoskeletal pain     Rx / DC Orders   ED Discharge Orders          Ordered    etodolac (LODINE) 400 MG tablet  2 times daily        09/27/21 0947             Note:  This document was prepared using Dragon voice recognition software and may include unintentional  dictation errors.   Tommi Rumps, PA-C 09/27/21 1226    Jene Every, MD 09/27/21 1250

## 2021-11-06 ENCOUNTER — Other Ambulatory Visit: Payer: Self-pay

## 2021-11-06 ENCOUNTER — Emergency Department
Admission: EM | Admit: 2021-11-06 | Discharge: 2021-11-06 | Disposition: A | Discharge status: Home / Self Care | Payer: BC Managed Care – PPO | Attending: Emergency Medicine | Admitting: Emergency Medicine

## 2021-11-06 ENCOUNTER — Encounter: Payer: Self-pay | Admitting: Emergency Medicine

## 2021-11-06 DIAGNOSIS — Z7984 Long term (current) use of oral hypoglycemic drugs: Secondary | ICD-10-CM | POA: Insufficient documentation

## 2021-11-06 DIAGNOSIS — F17200 Nicotine dependence, unspecified, uncomplicated: Secondary | ICD-10-CM | POA: Insufficient documentation

## 2021-11-06 DIAGNOSIS — M10072 Idiopathic gout, left ankle and foot: Secondary | ICD-10-CM | POA: Insufficient documentation

## 2021-11-06 DIAGNOSIS — E119 Type 2 diabetes mellitus without complications: Secondary | ICD-10-CM | POA: Insufficient documentation

## 2021-11-06 DIAGNOSIS — M25572 Pain in left ankle and joints of left foot: Secondary | ICD-10-CM | POA: Diagnosis present

## 2021-11-06 DIAGNOSIS — M109 Gout, unspecified: Secondary | ICD-10-CM

## 2021-11-06 MED ORDER — OXYCODONE-ACETAMINOPHEN 7.5-325 MG PO TABS
1.0000 | ORAL_TABLET | Freq: Once | ORAL | Status: AC
  Administered 2021-11-06: 1 via ORAL
  Filled 2021-11-06: qty 1

## 2021-11-06 MED ORDER — COLCHICINE 0.6 MG PO TABS: 0.6000 mg | ORAL_TABLET | Freq: Two times a day (BID) | ORAL | 0 refills | Status: AC

## 2021-11-06 MED ORDER — OXYCODONE-ACETAMINOPHEN 5-325 MG PO TABS: 1.0000 | ORAL_TABLET | Freq: Four times a day (QID) | ORAL | 0 refills | Status: DC | PRN

## 2021-11-06 NOTE — ED Triage Notes (Signed)
Pt comes into the ED via POV c/o left ankle pain that started on Sunday.  Pt denies any known injury to the ankle.  Pt does admit to h/o gout.  Pt in NAd at this time with even and unlabored respirations.  ?

## 2021-11-06 NOTE — ED Provider Notes (Signed)
? ?[facility] ?Provider Note ? ? ? Event Date/Time  ? First MD Initiated Contact with Patient 11/06/21 [phone]   ?  (approximate) ? ? ?History  ? ?Ankle Pain ? ? ?HPI ? ?John [name] is a 56 y.o. male   presents to the ED with complaint of ankle pain with sudden onset 3 days ago.  Patient has history of gout and has been on colchicine in the past.  No history of injury.  Patient has a history of gout, smoking and diabetes mellitus for which he takes metformin.  ? ?  ? ? ?Physical Exam  ? ?Triage Vital Signs: ?ED Triage Vitals  ?Enc Vitals Group  ?   BP 11/06/21 0707 131/82  ?   Pulse Rate 11/06/21 0707 70  ?   Resp 11/06/21 0707 18  ?   Temp 11/06/21 0707 (!) 97.5 ?F (36.4 ?C)  ?   Temp Source 11/06/21 0707 Oral  ?   SpO2 11/06/21 0707 97 %  ?   Weight 11/06/21 0704 177 lb 14.6 oz (80.7 kg)  ?   Height 11/06/21 0704 5\' 5"  (1.651 m)  ?   Head Circumference --   ?   Peak Flow --   ?   Pain Score 11/06/21 0704 5  ?   Pain Loc --   ?   Pain Edu? --   ?   Excl. in [place]? --   ? ? ?Most recent vital signs: ?Vitals:  ? 11/06/21 0707  ?BP: 131/82  ?Pulse: 70  ?Resp: 18  ?Temp: (!) 97.5 ?F (36.4 ?C)  ?SpO2: 97%  ? ? ? ?General: Awake, no distress.  ?CV:  Good peripheral perfusion.  Heart regular rate and rhythm. ?Resp:  Normal effort.  Lungs are clear bilaterally. ?Abd:  No distention.  ?Other:  Left ankle is warm and mildly erythematous.  Range of motion is restricted secondary to increased pain.  It is intact.  Pulses present both DP and PT.  No deformity. ? ? ?ED Results / Procedures / Treatments  ? ?Labs ?(all labs ordered are listed, but only abnormal results are displayed) ?Labs Reviewed - No data to display ? ? ? ? ?PROCEDURES: ? ?Critical Care performed:  ? ?Procedures ? ? ?MEDICATIONS ORDERED IN ED: ?Medications  ?oxyCODONE-acetaminophen (PERCOCET) 7.5-325 MG per tablet 1 tablet (1 tablet Oral Given 11/06/21 0728)  ? ? ? ?IMPRESSION / MDM / ASSESSMENT AND PLAN / ED COURSE  ?I reviewed the triage vital  signs and the nursing notes. ? ? ?Differential diagnosis includes, but is not limited to, acute gout flare left ankle. ? ?56 year old male presents to the ED with complaint of left ankle pain that started suddenly without any history of injury.  Patient states he has a history of gout and this feels very much like his gout in the past.  He states he is been on colchicine in the past and has not had any difficulty with it.  Physical exam is consistent with an acute gout flare.  Patient was given Percocet 5/325 in the ED.  A prescription for colchicine was sent to the pharmacy along with a limited number of Percocet.  He was given information about a low purine diet and instructions to follow-up with his PCP if any continued problems. ? ? ? ?  ? ? ?FINAL CLINICAL IMPRESSION(S) / ED DIAGNOSES  ? ?Final diagnoses:  ?Acute gout of left ankle, unspecified cause  ? ? ? ?Rx / DC Orders  ? ?  ED Discharge Orders   ? ?      Ordered  ?  colchicine 0.6 MG tablet  2 times daily       ? 11/06/21 0725  ?  oxyCODONE-acetaminophen (PERCOCET) 5-325 MG tablet  Every 6 hours PRN       ? 11/06/21 0725  ? ?  ?  ? ?  ? ? ? ?Note:  This document was prepared using Dragon voice recognition software and may include unintentional dictation errors. ?  ?Tommi Rumps, PA-C ?11/06/21 3419 ? ?  ?Delton [place], MD ?11/06/21 [phone] ? ?

## 2021-11-06 NOTE — ED Notes (Signed)
See triage note   presents with left ankle pain  states he has a hx of gout  pain started on Sunday w/o injury  good pulses   increased pain with standing ?

## 2021-11-06 NOTE — Discharge Instructions (Signed)
Follow-up with your primary care provider if any continued problems or concerns.  Read over the low purine diet which also should help with the amount of gout problems that you have had in the past and prevent them from the future.  The colchicine and oxycodone was sent to the pharmacy.  Be aware that the oxycodone could cause drowsiness and increase your risk for injury.  Take only as directed and make sure that you eat when taking the colchicine to avoid stomach upset. ?

## 2022-01-15 ENCOUNTER — Emergency Department
Admission: EM | Admit: 2022-01-15 | Discharge: 2022-01-15 | Disposition: A | Discharge status: Home / Self Care | Payer: BC Managed Care – PPO | Attending: Emergency Medicine | Admitting: Emergency Medicine

## 2022-01-15 ENCOUNTER — Emergency Department: Payer: BC Managed Care – PPO

## 2022-01-15 ENCOUNTER — Other Ambulatory Visit: Payer: Self-pay

## 2022-01-15 DIAGNOSIS — X58XXXA Exposure to other specified factors, initial encounter: Secondary | ICD-10-CM | POA: Insufficient documentation

## 2022-01-15 DIAGNOSIS — R519 Headache, unspecified: Secondary | ICD-10-CM | POA: Insufficient documentation

## 2022-01-15 DIAGNOSIS — E119 Type 2 diabetes mellitus without complications: Secondary | ICD-10-CM | POA: Insufficient documentation

## 2022-01-15 DIAGNOSIS — S161XXA Strain of muscle, fascia and tendon at neck level, initial encounter: Secondary | ICD-10-CM | POA: Insufficient documentation

## 2022-01-15 MED ORDER — NAPROXEN 500 MG PO TABS: 500.0000 mg | ORAL_TABLET | Freq: Two times a day (BID) | ORAL | 0 refills | Status: DC

## 2022-01-15 MED ORDER — DIAZEPAM 5 MG PO TABS
5.0000 mg | ORAL_TABLET | Freq: Once | ORAL | Status: AC
  Administered 2022-01-15: 5 mg via ORAL
  Filled 2022-01-15: qty 1

## 2022-01-15 MED ORDER — NAPROXEN 500 MG PO TABS
500.0000 mg | ORAL_TABLET | Freq: Once | ORAL | Status: AC
  Administered 2022-01-15: 500 mg via ORAL
  Filled 2022-01-15: qty 1

## 2022-01-15 MED ORDER — DIAZEPAM 5 MG PO TABS: 5.0000 mg | ORAL_TABLET | Freq: Three times a day (TID) | ORAL | 0 refills | Status: DC | PRN

## 2022-01-15 NOTE — ED Notes (Signed)
Pt attempted to sign for d/c paperwork and education but topaz froze up.
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
## 2022-01-15 NOTE — ED Triage Notes (Signed)
Pt states that he has been having pain in his neck since friday, denies any known injury, states that the pain radiates from the base of his head to both sides of his neck, pt states that he has been unable to sleep due tot he pain

## 2022-02-19 ENCOUNTER — Emergency Department
Admission: EM | Admit: 2022-02-19 | Discharge: 2022-02-19 | Disposition: A | Discharge status: Home / Self Care | Payer: Medicaid Other | Attending: Emergency Medicine | Admitting: Emergency Medicine

## 2022-02-19 ENCOUNTER — Other Ambulatory Visit: Payer: Self-pay

## 2022-02-19 DIAGNOSIS — I891 Lymphangitis: Secondary | ICD-10-CM | POA: Insufficient documentation

## 2022-02-19 DIAGNOSIS — E119 Type 2 diabetes mellitus without complications: Secondary | ICD-10-CM | POA: Insufficient documentation

## 2022-02-19 DIAGNOSIS — H9201 Otalgia, right ear: Secondary | ICD-10-CM | POA: Insufficient documentation

## 2022-02-19 LAB — BASIC METABOLIC PANEL
Anion gap: 7 (ref 5–15)
BUN: 21 mg/dL — ABNORMAL HIGH (ref 6–20)
CO2: 23 mmol/L (ref 22–32)
Calcium: 8.4 mg/dL — ABNORMAL LOW (ref 8.9–10.3)
Chloride: 107 mmol/L (ref 98–111)
Creatinine, Ser: 0.58 mg/dL — ABNORMAL LOW (ref 0.61–1.24)
GFR, Estimated: >60 mL/min (ref >60)
Glucose, Bld: 200 mg/dL — ABNORMAL HIGH (ref 70–99)
Potassium: 4.2 mmol/L (ref 3.5–5.1)
Sodium: 137 mmol/L (ref 135–145)

## 2022-02-19 LAB — CBC WITH DIFFERENTIAL/PLATELET
Abs Immature Granulocytes: 0.02 10*3/uL (ref 0.00–0.07)
Basophils Absolute: 0.1 10*3/uL (ref 0.0–0.1)
Basophils Relative: 1 %
Eosinophils Absolute: 0.3 10*3/uL (ref 0.0–0.5)
Eosinophils Relative: 4 %
HCT: 42.2 % (ref 39.0–52.0)
Hemoglobin: 13.7 g/dL (ref 13.0–17.0)
Immature Granulocytes: 0 %
Lymphocytes Relative: 38 %
Lymphs Abs: 2.7 10*3/uL (ref 0.7–4.0)
MCH: 27.2 pg (ref 26.0–34.0)
MCHC: 32.5 g/dL (ref 30.0–36.0)
MCV: 83.9 fL (ref 80.0–100.0)
Monocytes Absolute: 1.1 10*3/uL — ABNORMAL HIGH (ref 0.1–1.0)
Monocytes Relative: 15 %
Neutro Abs: 3.1 10*3/uL (ref 1.7–7.7)
Neutrophils Relative %: 42 %
Platelets: 240 10*3/uL (ref 150–400)
RBC: 5.03 MIL/uL (ref 4.22–5.81)
RDW: 13.4 % (ref 11.5–15.5)
WBC: 7.2 10*3/uL (ref 4.0–10.5)
nRBC: 0 % (ref 0.0–0.2)

## 2022-02-19 MED ORDER — CEPHALEXIN 500 MG PO CAPS: 500.0000 mg | ORAL_CAPSULE | Freq: Four times a day (QID) | ORAL | 0 refills | Status: AC

## 2022-02-19 NOTE — ED Triage Notes (Signed)
Pt c/o right ear pain with cervical node swelling over the past 5 days.

## 2022-02-19 NOTE — ED Notes (Signed)
Pt in bed, pt c/o R ear pain

## 2022-02-19 NOTE — ED Provider Notes (Signed)
[facility] Provider Note    Event Date/Time   First MD Initiated Contact with Patient 02/19/22 [phone]     (approximate)   History   Chief Complaint Ear Pain   HPI  John [name] is a 56 y.o. male with past medical history of diabetes who presents to the ED complaining of ear pain.  Patient reports that 2 days ago he developed pain in the lower part of his right ear.  Since then he has noticed increasing swelling and tenderness just below the ear near the upper part of his neck.  He has not noticed any pain inside the ear itself and has not had any difficulty with his hearing.  He has not noticed any drainage from the ear and denies any pain posterior to his ear.  He has not had any fevers and denies any trauma to the area.     Physical Exam   Triage Vital Signs: ED Triage Vitals  Enc Vitals Group     BP 02/19/22 0843 (!) 134/92     Pulse Rate 02/19/22 0843 64     Resp 02/19/22 0843 16     Temp 02/19/22 0843 97.8 F (36.6 C)     Temp Source 02/19/22 0843 Oral     SpO2 02/19/22 0843 97 %     Weight 02/19/22 0844 168 lb (76.2 kg)     Height 02/19/22 0844 5\' 5"  (1.651 m)     Head Circumference --      Peak Flow --      Pain Score 02/19/22 0844 5     Pain Loc --      Pain Edu? --      Excl. in [place]? --     Most recent vital signs: Vitals:   02/19/22 0843  BP: (!) 134/92  Pulse: 64  Resp: 16  Temp: 97.8 F (36.6 C)  SpO2: 97%    Constitutional: Alert and oriented. Eyes: Conjunctivae are normal. Head: Atraumatic. Nose: No congestion/rhinnorhea. Mouth/Throat: Mucous membranes are moist.  Ears: TMs clear bilaterally without erythema, bulging, or purulence.  External ears without erythema or tenderness.  Erythema and tenderness noted just below right ear with enlarged and tender lymph node.  No tenderness to palpation posterior to his ear and no mastoid tenderness. Cardiovascular: Normal rate, regular rhythm. Grossly normal heart sounds.  2+ radial  pulses bilaterally. Respiratory: Normal respiratory effort.  No retractions. Lungs CTAB. Gastrointestinal: Soft and nontender. No distention. Musculoskeletal: No lower extremity tenderness nor edema.  Neurologic:  Normal speech and language. No gross focal neurologic deficits are appreciated.    ED Results / Procedures / Treatments   Labs (all labs ordered are listed, but only abnormal results are displayed) Labs Reviewed  CBC WITH DIFFERENTIAL/PLATELET - Abnormal; Notable for the following components:      Result Value   Monocytes Absolute 1.1 (*)    All other components within normal limits  BASIC METABOLIC PANEL - Abnormal; Notable for the following components:   Glucose, Bld 200 (*)    BUN 21 (*)    Creatinine, Ser 0.58 (*)    Calcium 8.4 (*)    All other components within normal limits     PROCEDURES:  Critical Care performed: No  Procedures   MEDICATIONS ORDERED IN ED: Medications - No data to display   IMPRESSION / MDM / ASSESSMENT AND PLAN / ED COURSE  I reviewed the triage vital signs and the nursing notes.  56 y.o. male with past medical history of diabetes who presents to the ED complaining of pain and swelling for the past 2 days just below his right ear.  Patient's presentation is most consistent with acute complicated illness / injury requiring diagnostic workup.  Differential diagnosis includes, but is not limited to, otitis media, otitis externa, mastoiditis, lymphangitis, lymphoma.  Patient well-appearing and in no acute distress, vital signs are unremarkable.  Examination of his ear is unremarkable with no signs of otitis media or externa.  He has no tenderness over his mastoid process to suggest mastoiditis.  He does have tenderness and swollen lymph node with mild erythema just below his right ear, which seems consistent with a lymphangitis.  Given erythema and tenderness, we will start him on a course of Keflex.  Labs  are reassuring and remarkable only for mild hyperglycemia, no significant electrolyte abnormality, AKI, anemia, or leukocytosis noted.  Patient counseled to establish care with PCP and to return to the ED for new or worsening symptoms, patient agrees with plan.      FINAL CLINICAL IMPRESSION(S) / ED DIAGNOSES   Final diagnoses:  Lymphangitis  Right ear pain     Rx / DC Orders   ED Discharge Orders          Ordered    cephALEXin (KEFLEX) 500 MG capsule  4 times daily        02/19/22 1236             Note:  This document was prepared using Dragon voice recognition software and may include unintentional dictation errors.   Chesley Noon, MD 02/19/22 1240

## 2022-02-20 ENCOUNTER — Telehealth: Payer: Self-pay

## 2022-02-20 NOTE — Telephone Encounter (Signed)
Patient is interested in establishing care at [facility] per ER referral

## 2022-07-22 ENCOUNTER — Emergency Department
Admission: EM | Admit: 2022-07-22 | Discharge: 2022-07-22 | Disposition: A | Discharge status: Home / Self Care | Payer: Medicaid Other | Attending: Emergency Medicine | Admitting: Emergency Medicine

## 2022-07-22 ENCOUNTER — Other Ambulatory Visit: Payer: Self-pay

## 2022-07-22 ENCOUNTER — Encounter: Payer: Self-pay | Admitting: Emergency Medicine

## 2022-07-22 DIAGNOSIS — L0231 Cutaneous abscess of buttock: Secondary | ICD-10-CM | POA: Insufficient documentation

## 2022-07-22 DIAGNOSIS — L03317 Cellulitis of buttock: Secondary | ICD-10-CM

## 2022-07-22 DIAGNOSIS — Z7984 Long term (current) use of oral hypoglycemic drugs: Secondary | ICD-10-CM | POA: Insufficient documentation

## 2022-07-22 DIAGNOSIS — E119 Type 2 diabetes mellitus without complications: Secondary | ICD-10-CM | POA: Diagnosis not present

## 2022-07-22 LAB — CBC WITH DIFFERENTIAL/PLATELET
Abs Immature Granulocytes: 0.06 10*3/uL (ref 0.00–0.07)
Basophils Absolute: 0.1 10*3/uL (ref 0.0–0.1)
Basophils Relative: 0 %
Eosinophils Absolute: 0.1 10*3/uL (ref 0.0–0.5)
Eosinophils Relative: 1 %
HCT: 44 % (ref 39.0–52.0)
Hemoglobin: 14.3 g/dL (ref 13.0–17.0)
Immature Granulocytes: 0 %
Lymphocytes Relative: 21 %
Lymphs Abs: 3 10*3/uL (ref 0.7–4.0)
MCH: 27 pg (ref 26.0–34.0)
MCHC: 32.5 g/dL (ref 30.0–36.0)
MCV: 83 fL (ref 80.0–100.0)
Monocytes Absolute: 1.9 10*3/uL — ABNORMAL HIGH (ref 0.1–1.0)
Monocytes Relative: 13 %
Neutro Abs: 9.5 10*3/uL — ABNORMAL HIGH (ref 1.7–7.7)
Neutrophils Relative %: 65 %
Platelets: 263 10*3/uL (ref 150–400)
RBC: 5.3 MIL/uL (ref 4.22–5.81)
RDW: 12.8 % (ref 11.5–15.5)
WBC: 14.6 10*3/uL — ABNORMAL HIGH (ref 4.0–10.5)
nRBC: 0 % (ref 0.0–0.2)

## 2022-07-22 LAB — COMPREHENSIVE METABOLIC PANEL
ALT: 14 U/L (ref 0–44)
AST: 15 U/L (ref 15–41)
Albumin: 3.5 g/dL (ref 3.5–5.0)
Alkaline Phosphatase: 88 U/L (ref 38–126)
Anion gap: 7 (ref 5–15)
BUN: 14 mg/dL (ref 6–20)
CO2: 22 mmol/L (ref 22–32)
Calcium: 8.7 mg/dL — ABNORMAL LOW (ref 8.9–10.3)
Chloride: 105 mmol/L (ref 98–111)
Creatinine, Ser: 0.74 mg/dL (ref 0.61–1.24)
GFR, Estimated: >60 mL/min (ref >60)
Glucose, Bld: 306 mg/dL — ABNORMAL HIGH (ref 70–99)
Potassium: 3.8 mmol/L (ref 3.5–5.1)
Sodium: 134 mmol/L — ABNORMAL LOW (ref 135–145)
Total Bilirubin: 0.8 mg/dL (ref 0.3–1.2)
Total Protein: 7.2 g/dL (ref 6.5–8.1)

## 2022-07-22 MED ORDER — ONDANSETRON 4 MG PO TBDP
4.0000 mg | ORAL_TABLET | Freq: Once | ORAL | Status: AC
  Administered 2022-07-22: 4 mg via ORAL
  Filled 2022-07-22: qty 1

## 2022-07-22 MED ORDER — LIDOCAINE HCL (PF) 1 % IJ SOLN
10.0000 mL | Freq: Once | INTRAMUSCULAR | Status: AC
  Administered 2022-07-22: 10 mL via INTRADERMAL
  Filled 2022-07-22: qty 10

## 2022-07-22 MED ORDER — OXYCODONE-ACETAMINOPHEN 5-325 MG PO TABS
1.0000 | ORAL_TABLET | Freq: Once | ORAL | Status: AC
  Administered 2022-07-22: 1 via ORAL
  Filled 2022-07-22: qty 1

## 2022-07-22 MED ORDER — SULFAMETHOXAZOLE-TRIMETHOPRIM 800-160 MG PO TABS: 1.0000 | ORAL_TABLET | Freq: Two times a day (BID) | ORAL | 0 refills | Status: AC

## 2022-07-22 MED ORDER — LIDOCAINE-EPINEPHRINE-TETRACAINE (LET) TOPICAL GEL
3.0000 mL | Freq: Once | TOPICAL | Status: AC
  Administered 2022-07-22: 3 mL via TOPICAL
  Filled 2022-07-22: qty 3

## 2022-07-22 MED ORDER — OXYCODONE-ACETAMINOPHEN 5-325 MG PO TABS: 1.0000 | ORAL_TABLET | Freq: Four times a day (QID) | ORAL | 0 refills | Status: AC | PRN

## 2022-07-22 MED ORDER — SULFAMETHOXAZOLE-TRIMETHOPRIM 800-160 MG PO TABS
1.0000 | ORAL_TABLET | Freq: Once | ORAL | Status: AC
  Administered 2022-07-22: 1 via ORAL
  Filled 2022-07-22: qty 1

## 2022-07-22 MED ORDER — ONDANSETRON 4 MG PO TBDP: 4.0000 mg | ORAL_TABLET | Freq: Four times a day (QID) | ORAL | 0 refills | Status: AC | PRN

## 2022-07-22 NOTE — ED Provider Notes (Signed)
[facility] Provider Note    Event Date/Time   First MD Initiated Contact with Patient 07/22/22 [phone]     (approximate)   History   Abscess   HPI  John [name] is a 56 y.o. male with history of diabetes presents to the emergency department family with concerns for an abscess and discomfort in his gluteal cleft.  No drainage.  Has felt "feverish".  No vomiting.   History provided by patient and family.    Past Medical History:  Diagnosis Date   Diabetes mellitus without complication (HCC)     History reviewed. No pertinent surgical history.  MEDICATIONS:  Prior to Admission medications   Medication Sig Start Date End Date Taking? Authorizing Provider  albuterol (PROVENTIL HFA;VENTOLIN HFA) 108 (90 Base) MCG/ACT inhaler Inhale 2 puffs into the lungs every 6 (six) hours as needed for wheezing or shortness of breath. 03/29/18   Enid Derry, PA-C  colchicine 0.6 MG tablet Take 1 tablet (0.6 mg total) by mouth 2 (two) times daily. 11/06/21 11/06/22  Tommi Rumps, PA-C  diazepam (VALIUM) 5 MG tablet Take 1 tablet (5 mg total) by mouth every 8 (eight) hours as needed for muscle spasms. 01/15/22   Sharman Cheek, MD  metFORMIN (GLUCOPHAGE) 1000 MG tablet Take 1 tablet (1,000 mg total) by mouth 2 (two) times daily with a meal. 11/10/19 01/09/20  Shaune Pollack, MD  naproxen (NAPROSYN) 500 MG tablet Take 1 tablet (500 mg total) by mouth 2 (two) times daily with a meal. 01/15/22   Sharman Cheek, MD  oxyCODONE-acetaminophen (PERCOCET) 5-325 MG tablet Take 1 tablet by mouth every 6 (six) hours as needed for severe pain. 11/06/21 11/06/22  Tommi Rumps, PA-C    Physical Exam   Triage Vital Signs: ED Triage Vitals  Enc Vitals Group     BP 07/22/22 0534 (!) 144/92     Pulse Rate 07/22/22 0534 89     Resp 07/22/22 0534 18     Temp 07/22/22 0534 98.7 F (37.1 C)     Temp Source 07/22/22 0534 Oral     SpO2 07/22/22 0534 96 %     Weight 07/22/22 0534  170 lb (77.1 kg)     Height 07/22/22 0534 5\' 5"  (1.651 m)     Head Circumference --      Peak Flow --      Pain Score 07/22/22 0534 7     Pain Loc --      Pain Edu? --      Excl. in [place]? --     Most recent vital signs: Vitals:   07/22/22 0534  BP: (!) 144/92  Pulse: 89  Resp: 18  Temp: 98.7 F (37.1 C)  SpO2: 96%     CONSTITUTIONAL: Alert and responds appropriately to questions. Well-appearing; well-nourished HEAD: Normocephalic, atraumatic EYES: Conjunctivae clear, pupils appear equal ENT: normal nose; moist mucous membranes NECK: Normal range of motion CARD: Regular rate and rhythm RESP: Normal chest excursion without splinting or tachypnea; no hypoxia or respiratory distress, speaking full sentences ABD/GI: non-distended; patient has some induration and tenderness at the top of the left gluteal cleft without fluctuance.  There is some redness present.  No drainage. EXT: Normal ROM in all joints, no major deformities noted SKIN: Normal color for age and race, no rashes on exposed skin NEURO: Moves all extremities equally, normal speech, no facial asymmetry noted PSYCH: The patient's mood and manner are appropriate. Grooming and personal hygiene are appropriate.  ED Results / Procedures / Treatments   LABS: (all labs ordered are listed, but only abnormal results are displayed) Labs Reviewed  CBC WITH DIFFERENTIAL/PLATELET - Abnormal; Notable for the following components:      Result Value   WBC 14.6 (*)    Neutro Abs 9.5 (*)    Monocytes Absolute 1.9 (*)    All other components within normal limits  COMPREHENSIVE METABOLIC PANEL - Abnormal; Notable for the following components:   Sodium 134 (*)    Glucose, Bld 306 (*)    Calcium 8.7 (*)    All other components within normal limits     EKG:    RADIOLOGY: My personal review and interpretation of imaging:    I have personally reviewed all radiology reports. No results found.   PROCEDURES:  Critical Care  performed: No  INCISION AND DRAINAGE Performed by: Baxter Hire Jenna Routzahn Consent: Verbal consent obtained. Risks and benefits: risks, benefits and alternatives were discussed Type: abscess  Body area: Left gluteal cleft  Anesthesia: local infiltration  Incision was made with a scalpel.  Local anesthetic: lidocaine 1% without epinephrine  Anesthetic total: 8 ml  Complexity: complex Blunt dissection to break up loculations  Drainage: purulent  Drainage amount: Small, mostly bloody  Packing material: None   Patient tolerance: Patient tolerated the procedure well with no immediate complications.  Procedures    IMPRESSION / MDM / ASSESSMENT AND PLAN / ED COURSE  I reviewed the triage vital signs and the nursing notes.   Patient with abscess to the gluteal cleft.    DIFFERENTIAL DIAGNOSIS (includes but not limited to):   Abscess, cellulitis, no sign of perianal or perirectal abscess  Patient's presentation is most consistent with acute complicated illness / injury requiring diagnostic workup.  PLAN: Patient here with abscess with possible early cellulitis.  Hemodynamically stable.  Labs obtained from triage show no leukocytosis.  Glucose slightly elevated in 300s but no DKA.  Normal electrolytes.  Will perform incision and drainage after pain medication.  We will send wound culture.  We will start him on Bactrim.   MEDICATIONS GIVEN IN ED: Medications  oxyCODONE-acetaminophen (PERCOCET/ROXICET) 5-325 MG per tablet 1 tablet (1 tablet Oral Given 07/22/22 0617)  ondansetron (ZOFRAN-ODT) disintegrating tablet 4 mg (4 mg Oral Given 07/22/22 0617)  lidocaine (PF) (XYLOCAINE) 1 % injection 10 mL (10 mLs Intradermal Given 07/22/22 0705)  lidocaine-EPINEPHrine-tetracaine (LET) topical gel (3 mLs Topical Given 07/22/22 0620)  sulfamethoxazole-trimethoprim (BACTRIM DS) 800-160 MG per tablet 1 tablet (1 tablet Oral Given 07/22/22 0705)     ED COURSE: Incision and drainage performed at  bedside with only very minimal amount of purulent drainage mostly serosanguineous drainage.  Area was probed extensively.  Discussed with family that this may be more likely cellulitis rather than abscess.  Will discharge with Bactrim and pain medication.  They have a PCP for close follow-up.   At this time, I do not feel there is any life-threatening condition present. I reviewed all nursing notes, vitals, pertinent previous records.  All lab and urine results, EKGs, imaging ordered have been independently reviewed and interpreted by myself.  I reviewed all available radiology reports from any imaging ordered this visit.  Based on my assessment, I feel the patient is safe to be discharged home without further emergent workup and can continue workup as an outpatient as needed. Discussed all findings, treatment plan as well as usual and customary return precautions.  They verbalize understanding and are comfortable with this plan.  Outpatient  follow-up has been provided as needed.  All questions have been answered.    CONSULTS:  none   OUTSIDE RECORDS REVIEWED: Reviewed patient's last orthopedic note with no: Laying on 11/14/2020.     FINAL CLINICAL IMPRESSION(S) / ED DIAGNOSES   Final diagnoses:  Abscess, gluteal, left     Rx / DC Orders   ED Discharge Orders          Ordered    sulfamethoxazole-trimethoprim (BACTRIM DS) 800-160 MG tablet  2 times daily        07/22/22 0651    oxyCODONE-acetaminophen (PERCOCET) 5-325 MG tablet  Every 6 hours PRN        07/22/22 0651    ondansetron (ZOFRAN-ODT) 4 MG disintegrating tablet  Every 6 hours PRN        07/22/22 1478             Note:  This document was prepared using Dragon voice recognition software and may include unintentional dictation errors.   Arley Garant, Layla Maw, DO 07/22/22 0930

## 2022-07-22 NOTE — Discharge Instructions (Addendum)

## 2022-07-22 NOTE — ED Triage Notes (Signed)
Ambulatory to triage with c/o rectal abscess. Present x 2 days. Denies any drainage. Pt reports area is reddened, hard, and warm to touch.  Unsure if febrile at home

## 2022-07-24 ENCOUNTER — Other Ambulatory Visit: Payer: Self-pay

## 2022-07-24 ENCOUNTER — Encounter: Payer: Self-pay | Admitting: Emergency Medicine

## 2022-07-24 ENCOUNTER — Emergency Department
Admission: EM | Admit: 2022-07-24 | Discharge: 2022-07-24 | Disposition: A | Discharge status: Home / Self Care | Payer: Medicaid Other | Attending: Emergency Medicine | Admitting: Emergency Medicine

## 2022-07-24 DIAGNOSIS — L03317 Cellulitis of buttock: Secondary | ICD-10-CM | POA: Diagnosis not present

## 2022-07-24 DIAGNOSIS — L0231 Cutaneous abscess of buttock: Secondary | ICD-10-CM | POA: Diagnosis present

## 2022-07-24 NOTE — ED Notes (Signed)
See triage note  Presents with increased pain  Was seen and dx'd with rectal abscess couple of days ago  Pain has increased   Afebrile on arrival

## 2022-07-24 NOTE — Discharge Instructions (Addendum)
-  Please continue your antibiotics as prescribed for the entire course.  You may additionally utilize your pain medication as needed as well.  -Please follow-up with your regular doctor as needed.  -Return to the emergency department anytime if you begin to experience any new or worsening symptoms.

## 2022-07-24 NOTE — ED Provider Notes (Signed)
[facility] Provider Note    Event Date/Time   First MD Initiated Contact with Patient 07/24/22 [phone]     (approximate)   History   Chief Complaint Abscess   HPI John [name] is a 56 y.o. male, history of diabetes, presents to the emergency department for suspected abscess along the gluteal cleft.  He states that he was here on 07/22/2022 for the same symptoms.  He underwent incision and drainage and then placed on Bactrim.  Since then, he states that overall the swelling has improved, but he continues to still have some "hardening" of the area and is still painful.  He states that he would like it checked out and would also like to have an extension of his work note.  Denies fever/chills, myalgias, chest pain, shortness of breath, diarrhea, hematochezia, hematemesis, weakness, rash/lesions, or urinary symptoms.  History Limitations: No limitations.        Physical Exam  Triage Vital Signs: ED Triage Vitals  Enc Vitals Group     BP 07/24/22 0856 (!) 159/103     Pulse Rate 07/24/22 0856 86     Resp 07/24/22 0856 20     Temp 07/24/22 0856 98.3 F (36.8 C)     Temp Source 07/24/22 0856 Oral     SpO2 07/24/22 0856 98 %     Weight 07/24/22 0855 185 lb (83.9 kg)     Height 07/24/22 0855 5\' 5"  (1.651 m)     Head Circumference --      Peak Flow --      Pain Score --      Pain Loc --      Pain Edu? --      Excl. in [place]? --     Most recent vital signs: Vitals:   07/24/22 0856  BP: (!) 159/103  Pulse: 86  Resp: 20  Temp: 98.3 F (36.8 C)  SpO2: 98%    General: Awake, NAD.  Skin: Warm, dry. No rashes or lesions.  Eyes: PERRL. Conjunctivae normal.  CV: Good peripheral perfusion.  Resp: Normal effort.  Abd: Soft, non-tender. No distention.  Neuro: At baseline. No gross neurological deficits.  Musculoskeletal: Normal ROM of all extremities.  Focused Exam: Mild induration noted along the right gluteal cleft, approximately 2.5 cm in diameter.  No  fluctuance.  No significant erythema.  Mild tenderness.  No active bleeding or discharge.  Physical Exam    ED Results / Procedures / Treatments  Labs (all labs ordered are listed, but only abnormal results are displayed) Labs Reviewed - No data to display   EKG N/A.    RADIOLOGY  ED Provider Interpretation: N/A.  No results found.  PROCEDURES:  Critical Care performed: N/A.  Procedures    MEDICATIONS ORDERED IN ED: Medications - No data to display   IMPRESSION / MDM / ASSESSMENT AND PLAN / ED COURSE  I reviewed the triage vital signs and the nursing notes.                              Differential diagnosis includes, but is not limited to, cellulitis, pilonidal abscess, anal fissure, hemorrhoids.   Assessment/Plan 14/07/23 consistent with recovering pilonidal abscess/cellulitis.  His symptoms are overall improving.  He states that his  He denies any systemic symptoms.  His physical exam does not show any areas of fluctuance or significant regions of erythema.  There is some induration, though he is  only 48 hours into his antibiotic therapy.  Advised him to continue his antibiotics.  He states that he does not need any refills on his pain medication.  Will extend his work note for another few days, as it does look to be still painful for him.  Will discharge.   Provided the patient with anticipatory guidance, return precautions, and educational material. Encouraged the patient to return to the emergency department at any time if they begin to experience any new or worsening symptoms. Patient expressed understanding and agreed with the plan.   Patient's presentation is most consistent with acute, uncomplicated illness.       FINAL CLINICAL IMPRESSION(S) / ED DIAGNOSES   Final diagnoses:  Cellulitis, gluteal     Rx / DC Orders   ED Discharge Orders     None        Note:  This document was prepared using Dragon voice recognition software and may  include unintentional dictation errors.   Varney Daily, [place] 07/24/22 8786    Minna Antis, MD 07/24/22 1530

## 2022-07-24 NOTE — ED Triage Notes (Signed)
Pt via POV from home. Pt c/o rectal abscess, states that was seen here and had it drained on 12/5. States that its starting to be more painful. Pt has been taking his antibiotics as prescribed. Pt is A&OX4 and NAD

## 2022-07-24 NOTE — ED Notes (Signed)
This RN accompanied EDP for assessment of abscess. Pt with abscess on right buttock. No drainage noted

## 2022-09-09 ENCOUNTER — Other Ambulatory Visit: Payer: Self-pay

## 2022-09-09 ENCOUNTER — Emergency Department
Admission: EM | Admit: 2022-09-09 | Discharge: 2022-09-09 | Disposition: A | Discharge status: Home / Self Care | Payer: Medicaid Other | Attending: Emergency Medicine | Admitting: Emergency Medicine

## 2022-09-09 ENCOUNTER — Encounter: Payer: Self-pay | Admitting: Emergency Medicine

## 2022-09-09 DIAGNOSIS — M7662 Achilles tendinitis, left leg: Secondary | ICD-10-CM

## 2022-09-09 DIAGNOSIS — E119 Type 2 diabetes mellitus without complications: Secondary | ICD-10-CM | POA: Diagnosis not present

## 2022-09-09 DIAGNOSIS — M79672 Pain in left foot: Secondary | ICD-10-CM | POA: Diagnosis present

## 2022-09-09 HISTORY — DX: Unspecified osteoarthritis, unspecified site: M19.90

## 2022-09-09 HISTORY — DX: Gout, unspecified: M10.9

## 2022-09-09 LAB — COMPREHENSIVE METABOLIC PANEL
ALT: 14 U/L (ref 0–44)
AST: 19 U/L (ref 15–41)
Albumin: 3.6 g/dL (ref 3.5–5.0)
Alkaline Phosphatase: 83 U/L (ref 38–126)
Anion gap: 7 (ref 5–15)
BUN: 17 mg/dL (ref 6–20)
CO2: 24 mmol/L (ref 22–32)
Calcium: 8.6 mg/dL — ABNORMAL LOW (ref 8.9–10.3)
Chloride: 106 mmol/L (ref 98–111)
Creatinine, Ser: 0.57 mg/dL — ABNORMAL LOW (ref 0.61–1.24)
GFR, Estimated: >60 mL/min (ref >60)
Glucose, Bld: 244 mg/dL — ABNORMAL HIGH (ref 70–99)
Potassium: 3.7 mmol/L (ref 3.5–5.1)
Sodium: 137 mmol/L (ref 135–145)
Total Bilirubin: 0.6 mg/dL (ref 0.3–1.2)
Total Protein: 6.7 g/dL (ref 6.5–8.1)

## 2022-09-09 LAB — CBC WITH DIFFERENTIAL/PLATELET
Abs Immature Granulocytes: 0.03 10*3/uL (ref 0.00–0.07)
Basophils Absolute: 0.1 10*3/uL (ref 0.0–0.1)
Basophils Relative: 1 %
Eosinophils Absolute: 0.3 10*3/uL (ref 0.0–0.5)
Eosinophils Relative: 3 %
HCT: 39.9 % (ref 39.0–52.0)
Hemoglobin: 13 g/dL (ref 13.0–17.0)
Immature Granulocytes: 0 %
Lymphocytes Relative: 29 %
Lymphs Abs: 3.1 10*3/uL (ref 0.7–4.0)
MCH: 27 pg (ref 26.0–34.0)
MCHC: 32.6 g/dL (ref 30.0–36.0)
MCV: 83 fL (ref 80.0–100.0)
Monocytes Absolute: 1.2 10*3/uL — ABNORMAL HIGH (ref 0.1–1.0)
Monocytes Relative: 11 %
Neutro Abs: 6 10*3/uL (ref 1.7–7.7)
Neutrophils Relative %: 56 %
Platelets: 249 10*3/uL (ref 150–400)
RBC: 4.81 MIL/uL (ref 4.22–5.81)
RDW: 13 % (ref 11.5–15.5)
WBC: 10.7 10*3/uL — ABNORMAL HIGH (ref 4.0–10.5)
nRBC: 0 % (ref 0.0–0.2)

## 2022-09-09 LAB — URIC ACID: Uric Acid, Serum: 4.3 mg/dL (ref 3.7–8.6)

## 2022-09-09 MED ORDER — OXYCODONE-ACETAMINOPHEN 5-325 MG PO TABS
1.0000 | ORAL_TABLET | Freq: Once | ORAL | Status: DC
  Filled 2022-09-09: qty 1

## 2022-09-09 MED ORDER — PREDNISONE 10 MG (21) PO TBPK: ORAL_TABLET | ORAL | 0 refills | Status: AC

## 2022-09-09 NOTE — ED Provider Notes (Signed)
[facility] Provider Note    Event Date/Time   First MD Initiated Contact with Patient 09/09/22 1857     (approximate)   History   Foot Pain   HPI  John [name] is a 57 y.o. male with history of diabetes, gout and arthritis presents emergency department complaint of left heel pain for 3 days.  Patient states it feels like gout.  Is very painful to bear weight.  Painful to move his foot up and down.  No numbness or tingling.  No known injury      Physical Exam   Triage Vital Signs: ED Triage Vitals [09/09/22 1814]  Enc Vitals Group     BP 135/66     Pulse Rate 82     Resp 18     Temp 99.2 F (37.3 C)     Temp Source Oral     SpO2 97 %     Weight 182 lb (82.6 kg)     Height 5\' 5"  (1.651 m)     Head Circumference      Peak Flow      Pain Score 10     Pain Loc      Pain Edu?      Excl. in [place]?     Most recent vital signs: Vitals:   09/09/22 1814  BP: 135/66  Pulse: 82  Resp: 18  Temp: 99.2 F (37.3 C)  SpO2: 97%     General: Awake, no distress.   CV:  Good peripheral perfusion. regular rate and  rhythm Resp:  Normal effort.  Abd:  No distention.   Other:  Left Achilles tender to palpation, Achilles is intact, patient does have full range of motion of the foot, neurovascular is intact, no redness or swelling to indicate gout   ED Results / Procedures / Treatments   Labs (all labs ordered are listed, but only abnormal results are displayed) Labs Reviewed  COMPREHENSIVE METABOLIC PANEL - Abnormal; Notable for the following components:      Result Value   Glucose, Bld 244 (*)    Creatinine, Ser 0.57 (*)    Calcium 8.6 (*)    All other components within normal limits  CBC WITH DIFFERENTIAL/PLATELET - Abnormal; Notable for the following components:   WBC 10.7 (*)    Monocytes Absolute 1.2 (*)    All other components within normal limits  URIC ACID      EKG     RADIOLOGY     PROCEDURES:   Procedures   MEDICATIONS ORDERED IN ED: Medications  oxyCODONE-acetaminophen (PERCOCET/ROXICET) 5-325 MG per tablet 1 tablet (has no administration in time range)     IMPRESSION / MDM / ASSESSMENT AND PLAN / ED COURSE  I reviewed the triage vital signs and the nursing notes.                              Differential diagnosis includes, but is not limited to, gout, cellulitis, tendinitis, Achilles rupture  Patient's presentation is most consistent with acute complicated illness / injury requiring diagnostic workup.   Patient's labs are reassuring does not indicate gout or infection  Patient's physical exam is more consistent with Achilles tendinitis.  Did explain all the findings to the patient.  He was given a prescription for Sterapred.  He is to monitor glucose levels closely.  He was also placed in a cam walker boot to rest the Achilles tendon.  Was given a work note.  He is to elevate and ice.  Return emergency department worsening.  Follow-up with podiatry.  He is in agreement treatment plan.  He was discharged stable condition.      FINAL CLINICAL IMPRESSION(S) / ED DIAGNOSES   Final diagnoses:  Achilles tendinitis of left lower extremity     Rx / DC Orders   ED Discharge Orders          Ordered    predniSONE (STERAPRED UNI-PAK 21 TAB) 10 MG (21) TBPK tablet        09/09/22 1916             Note:  This document was prepared using Dragon voice recognition software and may include unintentional dictation errors.    Versie Starks, PA-C 09/09/22 1958    Naaman Plummer, MD 09/09/22 [phone]

## 2022-09-09 NOTE — Discharge Instructions (Signed)
Take medication as Tylenol for pain as needed.  Elevate and ice Follow-up with podiatry if not improved in 5 to 7 days

## 2022-09-09 NOTE — ED Provider Triage Note (Signed)
Emergency Medicine Provider Triage Evaluation Note  Huie Ghuman , a 57 y.o. male  was evaluated in triage.  Pt complains of gout flare. HX of same. Same symptoms. Patient has no more meds at home.  Review of Systems  Positive: Foot pain Negative: Wounds, fever  Physical Exam  BP 135/66   Pulse 82   Temp 99.2 F (37.3 C) (Oral)   Resp 18   Ht 5\' 5"  (1.651 m)   Wt 82.6 kg   SpO2 97%   BMI 30.29 kg/m  Gen:   Awake, no distress   Resp:  Normal effort  MSK:   Moves extremities without difficulty  Other:    Medical Decision Making  Medically screening exam initiated at 6:18 PM.  Appropriate orders placed.  Romir Klimowicz was informed that the remainder of the evaluation will be completed by another provider, this initial triage assessment does not replace that evaluation, and the importance of remaining in the ED until their evaluation is complete.  HX gout. Similar symptoms. Labs at this time   Brynda Peon 09/09/22 1818

## 2022-09-09 NOTE — ED Triage Notes (Signed)
Patient to ED for pain to left heel. Patient states it is a gout flair up. Pain ongoing for the past 3 days. Hx of diabetes

## 2022-10-21 NOTE — Progress Notes (Deleted)
Argentina Ponder Roslin Norwood,acting as a Education administrator for [employer], PA-C.,have documented all relevant documentation on the behalf of Mardene Speak, PA-C,as directed by  [employer], PA-C while in the presence of [employer], PA-C.   New patient visit   Patient: John [name]   DOB: 12-Apr-1966   57 y.o. Male  MRN: [id number] Visit Date: 10/22/2022  Today's healthcare provider: Mardene Speak, PA-C   No chief complaint on file.  Subjective    John [name] is a 57 y.o. male who presents today as a new patient to establish care.  HPI  ***  Past Medical History:  Diagnosis Date   Arthritis    Diabetes mellitus without complication ([place])    Gout    No past surgical history on file. No family status information on file.   No family history on file. Social History   Socioeconomic History   Marital status: Single    Spouse name: Not on file   Number of children: Not on file   Years of education: Not on file   Highest education level: Not on file  Occupational History   Not on file  Tobacco Use   Smoking status: Every Day    Packs/day: 0.50    Types: Cigarettes   Smokeless tobacco: Never  Vaping Use   Vaping Use: Never used  Substance and Sexual Activity   Alcohol use: Yes   Drug use: Not Currently   Sexual activity: Yes  Other Topics Concern   Not on file  Social History Narrative   Not on file   Social Determinants of Health   Financial Resource Strain: Not on file  Food Insecurity: Not on file  Transportation Needs: Not on file  Physical Activity: Not on file  Stress: Not on file  Social Connections: Not on file   Outpatient Medications Prior to Visit  Medication Sig   albuterol (PROVENTIL HFA;VENTOLIN HFA) 108 (90 Base) MCG/ACT inhaler Inhale 2 puffs into the lungs every 6 (six) hours as needed for wheezing or shortness of breath.   colchicine 0.6 MG tablet Take 1 tablet (0.6 mg total) by mouth 2 (two) times daily.   metFORMIN (GLUCOPHAGE) 1000 MG tablet Take 1  tablet (1,000 mg total) by mouth 2 (two) times daily with a meal.   ondansetron (ZOFRAN-ODT) 4 MG disintegrating tablet Take 1 tablet (4 mg total) by mouth every 6 (six) hours as needed for nausea or vomiting.   oxyCODONE-acetaminophen (PERCOCET) 5-325 MG tablet Take 1 tablet by mouth every 6 (six) hours as needed for severe pain.   predniSONE (STERAPRED UNI-PAK 21 TAB) 10 MG (21) TBPK tablet Take 6 pills on day one then decrease by 1 pill each day   sulfamethoxazole-trimethoprim (BACTRIM DS) 800-160 MG tablet Take 1 tablet by mouth 2 (two) times daily.   No facility-administered medications prior to visit.   Allergies  Allergen Reactions   Black Walnut Flavor Rash    walnuts     There is no immunization history on file for this patient.  Health Maintenance  Topic Date Due   COVID-19 Vaccine (1) Never done   HIV Screening  Never done   Hepatitis C Screening  Never done   DTaP/Tdap/Td (1 - Tdap) Never done   COLONOSCOPY (Pts 45-58yr Insurance coverage will need to be confirmed)  Never done   Zoster Vaccines- Shingrix (1 of 2) Never done   INFLUENZA VACCINE  Never done   HPV VACCINES  Aged Out    Patient Care Team:  Wynona Canes, MD as PCP - General (Neurology)  Review of Systems  {Labs  Heme  Chem  Endocrine  Serology  Results Review (optional):23779}   Objective    There were no vitals taken for this visit. {Show previous vital signs (optional):23777}  Physical Exam ***  Depression Screen     No data to display         No results found for any visits on 10/22/22.  Assessment & Plan     ***  No follow-ups on file.     {provider attestation***:1}   Mardene Speak, PA-C  [place] [phone] (phone) [phone] (fax)  [place]

## 2022-10-22 ENCOUNTER — Ambulatory Visit: Payer: Medicaid Other | Admitting: Physician Assistant

## 2023-12-16 ENCOUNTER — Emergency Department: Admission: EM | Admit: 2023-12-16 | Discharge: 2023-12-16 | Disposition: A | Discharge status: Home / Self Care

## 2023-12-16 ENCOUNTER — Other Ambulatory Visit: Payer: Self-pay

## 2023-12-16 DIAGNOSIS — E119 Type 2 diabetes mellitus without complications: Secondary | ICD-10-CM | POA: Diagnosis not present

## 2023-12-16 DIAGNOSIS — M25532 Pain in left wrist: Secondary | ICD-10-CM | POA: Diagnosis present

## 2023-12-16 MED ORDER — KETOROLAC TROMETHAMINE 30 MG/ML IJ SOLN
30.0000 mg | Freq: Once | INTRAMUSCULAR | Status: AC
  Administered 2023-12-16: 30 mg via INTRAMUSCULAR
  Filled 2023-12-16: qty 1

## 2023-12-16 MED ORDER — LIDOCAINE-EPINEPHRINE (PF) 2 %-1:200000 IJ SOLN
10.0000 mL | Freq: Once | INTRAMUSCULAR | Status: DC
  Filled 2023-12-16: qty 20

## 2023-12-16 MED ORDER — ACETAMINOPHEN 500 MG PO TABS
1000.0000 mg | ORAL_TABLET | Freq: Once | ORAL | Status: AC
  Administered 2023-12-16: 1000 mg via ORAL
  Filled 2023-12-16: qty 2

## 2023-12-16 MED ORDER — ACETAMINOPHEN 500 MG PO TABS: 1000.0000 mg | ORAL_TABLET | Freq: Four times a day (QID) | ORAL | 2 refills | Status: AC | PRN

## 2023-12-16 MED ORDER — NAPROXEN 500 MG PO TABS: 500.0000 mg | ORAL_TABLET | Freq: Two times a day (BID) | ORAL | 0 refills | Status: AC

## 2023-12-16 NOTE — ED Triage Notes (Signed)
 Pt to Ed via POV from home. Pt reports left wrist pain/swelling x1 day. Denies injury. Pt reports 8/10 pain.

## 2023-12-16 NOTE — Discharge Instructions (Signed)
 Your evaluation in the emergency department was notable for inflammation of your left wrist.  I suspect you are having a flare of your gout.  I have provided you with a handout on foods to avoid to help ease your symptoms.  Please take the prescribed medications and follow-up with your primary care doctor for reevaluation.  Return to the emergency department with any new or worsening symptoms including worsening pain or swelling, fever, or any other symptoms concerning to you.

## 2023-12-16 NOTE — ED Provider Notes (Signed)
**John John**  John John    Event Date/Time   First MD Initiated Contact with Patient 12/16/23 [phone]     (approximate)   History   Wrist Pain  Pt to Ed via POV from home. Pt reports left wrist pain/swelling x1 day. Denies injury. Pt reports 8/10 pain.    HPI Karandeep Haskill is a 58 y.o. male  pmh gout, diabetes, arthritis presents for evaluation of left wrist pain - Present since last night, no preceding trauma, no fever, no rash - Has had gout in right wrist and left knee previously, does not take any maintenance medications - RHD      Physical Exam   Triage Vital Signs: ED Triage Vitals  Encounter Vitals Group     BP 12/16/23 0737 122/82     Systolic BP Percentile --      Diastolic BP Percentile --      Pulse Rate 12/16/23 0737 75     Resp 12/16/23 0737 20     Temp 12/16/23 0736 98 F (36.7 C)     Temp Source 12/16/23 0736 Oral     SpO2 12/16/23 0737 100 %     Weight --      Height --      Head Circumference --      Peak Flow --      Pain Score 12/16/23 0736 8     Pain Loc --      Pain Education --      Exclude from Growth Chart --     Most recent vital signs: Vitals:   12/16/23 0736 12/16/23 0737  BP:  122/82  Pulse:  75  Resp:  20  Temp: 98 F (36.7 C)   SpO2:  100%     General: Awake, no distress.  CV:  Good peripheral perfusion. RP 2+ Resp:  Normal effort.  LUE:   Mild/mod swelling of L wrist, range of motion intact, no significant warmth, no overlying erythema.  RP 2+.  Radian/median/ulnar motor intact.  SI LT.   ED Results / Procedures / Treatments   Labs (all labs ordered are listed, but only abnormal results are displayed) Labs Reviewed - No data to display   EKG N/a    RADIOLOGY N/a    PROCEDURES:  Critical Care performed: No  Procedures   MEDICATIONS ORDERED IN ED: Medications  lidocaine -EPINEPHrine  (XYLOCAINE  W/EPI) 2 %-1:200000 (PF) injection 10 mL (has no administration in time range)   acetaminophen  (TYLENOL ) tablet 1,000 mg (1,000 mg Oral Given 12/16/23 0808)  ketorolac  (TORADOL ) 30 MG/ML injection 30 mg (30 mg Intramuscular Given 12/16/23 0809)     IMPRESSION / MDM / ASSESSMENT AND PLAN / ED COURSE  I reviewed the triage vital signs and the nursing notes.                              DDX/MDM/AP: Differential diagnosis includes, but is not limited to, very likely gout or other inflammatory arthritis, highly doubt septic joint, no history to suggest underlying fracture.  No evidence of cellulitis.  Plan: - Tylenol , Toradol  - In shared decision-making with patient, he would like to proceed with an attempt at arthrocentesis  Patient's presentation is most consistent with acute complicated illness / injury requiring diagnostic workup.   ED course below.  Patient subsequently declined arthrocentesis, has decision-making capacity, aware of risks.  Overall very low clinical concern for underlying infection on my eval --highly  suspect gout as etiology of presentation.  Doing much better after Tylenol , Toradol .  Rx naproxen , Tylenol .  Strict ED return precautions in place.  Plan for PMD follow-up.  Clinical Course as of 12/16/23 1610  Wed Dec 16, 2023  0828 Patient reevaluated, states he is already feeling much better, able to range wrist much more comfortably.  Will preparing for arthrocentesis patient decided that he did not in fact wanted anymore.  Understands risk of possible undiagnosed infection.  Will monitor closely.  Plan for PMD follow-up.  Strict ED return precautions in place.  Counseled on purine diet.  Plan for Tylenol , naproxen . [MM]    Clinical Course User Index [MM] Collis Deaner, MD     FINAL CLINICAL IMPRESSION(S) / ED DIAGNOSES   Final diagnoses:  Left wrist pain     Rx / DC Orders   ED Discharge Orders          Ordered    naproxen  (NAPROSYN ) 500 MG tablet  2 times daily with meals        12/16/23 0829    acetaminophen  (TYLENOL ) 500 MG  tablet  Every 6 hours PRN        12/16/23 9604             John:  This document was prepared using Dragon voice recognition software and may include unintentional dictation errors.   Collis Deaner, MD 12/16/23 [phone]
